# Patient Record
Sex: Female | Born: 1960 | Race: Black or African American | Hispanic: No | Smoking: Never smoker
Health system: Southern US, Community
[De-identification: ages and names within clinical notes are randomized; demographics above are authoritative.]

## PROBLEM LIST (undated history)

## (undated) DIAGNOSIS — E079 Disorder of thyroid, unspecified: Secondary | ICD-10-CM

## (undated) DIAGNOSIS — E0590 Thyrotoxicosis, unspecified without thyrotoxic crisis or storm: Secondary | ICD-10-CM

## (undated) DIAGNOSIS — G35 Multiple sclerosis: Secondary | ICD-10-CM

## (undated) DIAGNOSIS — D649 Anemia, unspecified: Secondary | ICD-10-CM

## (undated) DIAGNOSIS — I10 Essential (primary) hypertension: Secondary | ICD-10-CM

## (undated) HISTORY — PX: OTHER SURGICAL HISTORY: SHX169

## (undated) HISTORY — DX: Multiple sclerosis: G35

## (undated) HISTORY — PX: HERNIA REPAIR: SHX51

## (undated) HISTORY — DX: Anemia, unspecified: D64.9

## (undated) HISTORY — DX: Essential (primary) hypertension: I10

## (undated) HISTORY — DX: Thyrotoxicosis, unspecified without thyrotoxic crisis or storm: E05.90

## (undated) HISTORY — DX: Disorder of thyroid, unspecified: E07.9

## (undated) HISTORY — PX: ABDOMINAL HYSTERECTOMY: SHX81

---

## 1999-12-08 ENCOUNTER — Ambulatory Visit (HOSPITAL_COMMUNITY): Admission: RE | Admit: 1999-12-08 | Discharge: 1999-12-08 | Payer: Self-pay

## 2001-06-30 ENCOUNTER — Inpatient Hospital Stay (HOSPITAL_COMMUNITY): Admission: RE | Admit: 2001-06-30 | Discharge: 2001-07-02 | Payer: Self-pay

## 2001-06-30 ENCOUNTER — Encounter (INDEPENDENT_AMBULATORY_CARE_PROVIDER_SITE_OTHER): Payer: Self-pay

## 2003-07-27 ENCOUNTER — Ambulatory Visit (HOSPITAL_COMMUNITY): Admission: RE | Admit: 2003-07-27 | Discharge: 2003-07-27 | Payer: Self-pay | Admitting: Family Medicine

## 2003-08-13 ENCOUNTER — Ambulatory Visit (HOSPITAL_COMMUNITY): Admission: RE | Admit: 2003-08-13 | Discharge: 2003-08-13 | Payer: Self-pay | Admitting: Interventional Cardiology

## 2003-08-25 ENCOUNTER — Ambulatory Visit (HOSPITAL_COMMUNITY): Admission: RE | Admit: 2003-08-25 | Discharge: 2003-08-25 | Payer: Self-pay | Admitting: Interventional Cardiology

## 2003-09-02 ENCOUNTER — Other Ambulatory Visit: Admission: RE | Admit: 2003-09-02 | Discharge: 2003-09-02 | Payer: Self-pay | Admitting: Family Medicine

## 2004-04-30 DIAGNOSIS — G35 Multiple sclerosis: Secondary | ICD-10-CM

## 2004-04-30 HISTORY — DX: Multiple sclerosis: G35

## 2004-06-26 ENCOUNTER — Encounter: Admission: RE | Admit: 2004-06-26 | Discharge: 2004-06-26 | Payer: Self-pay | Admitting: Family Medicine

## 2004-07-19 ENCOUNTER — Encounter: Admission: RE | Admit: 2004-07-19 | Discharge: 2004-07-19 | Payer: Self-pay | Admitting: Neurology

## 2004-08-15 IMAGING — CT CT ANGIO CHEST
1 of 6 series · 10 of 31 positions shown · IV contrast ([ID] OMNI 300)
Comparison: none

CLINICAL DATA: Dyspnea, chest pain. 
CT ANGIO CHEST ? 08/25/03
Comparison to noncontrast CT chest of 08/13/03.
TECHNIQUE: Multidetector CT imaging of the chest was performed according to the protocol for detection of pulmonary embolism during IV bolus injection of 366cc Omnipaque 300.  Coronal and sagittal plane reformatted images were also generated.  
No filling defects in the pulmonary arterial system are noted to suggest pulmonary emboli.  Heart and great vessels are unremarkable. No enlarged lymph nodes in axillary mediastinal or hilar regions.  Unchanged 4 mm nodule on the right minor fissure (image 107) and a 5 mm nodule on the lateral right lower lobe (image 152) are unchanged.  The remainder of the lungs is clear.

[Series 3: pe w/ lower ext · axial · 0.70mm/px · z∈[-340,-65]mm · 10 of 276 slices shown]
[im 28/276  lung]
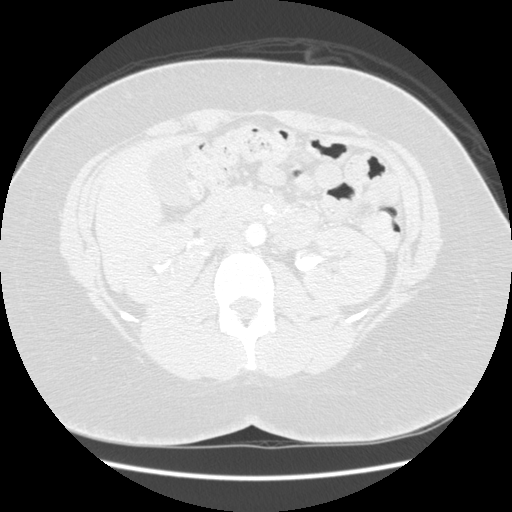
[im 56/276  mediastinal]
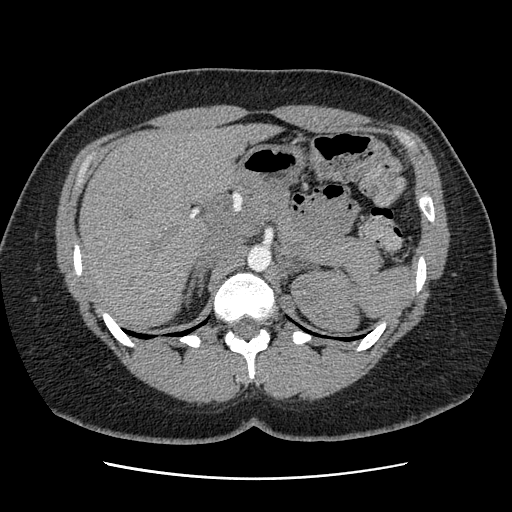
[im 83/276  lung]
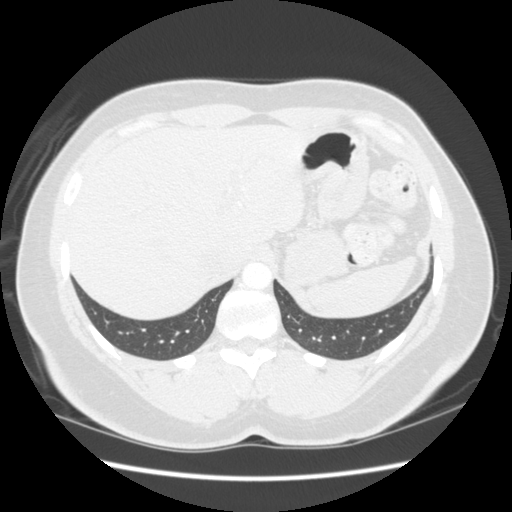
[im 111/276  mediastinal]
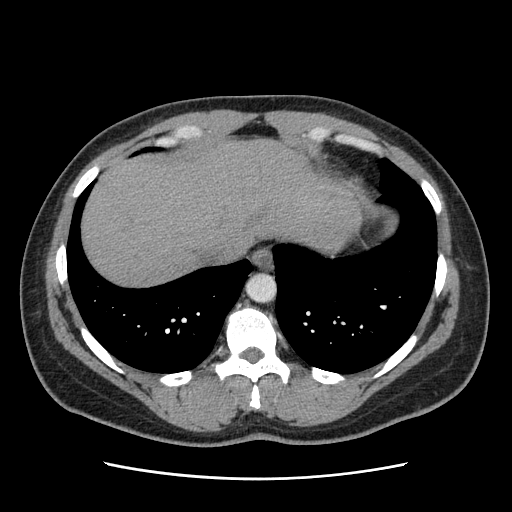
[im 136/276  lung]
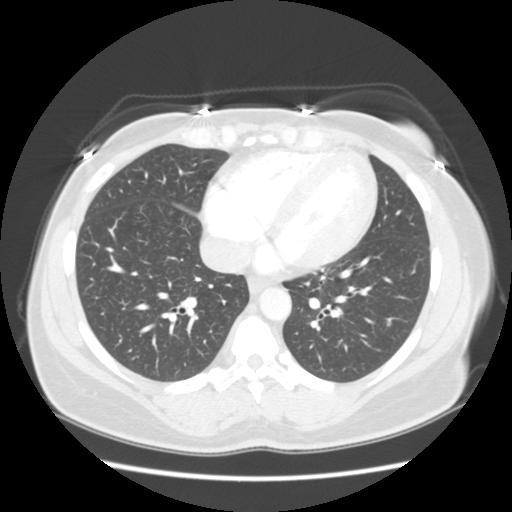
[im 138/276  mediastinal]
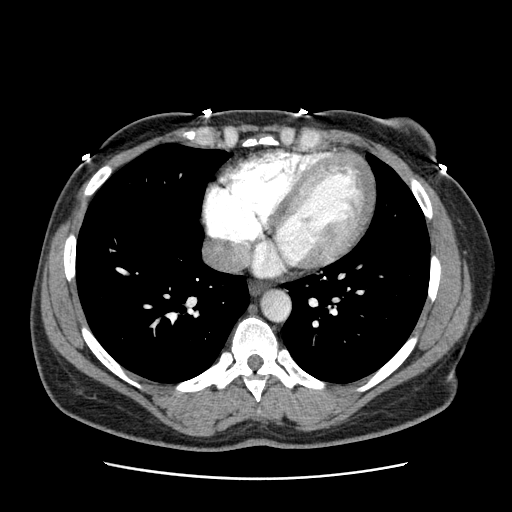
[im 166/276  lung]
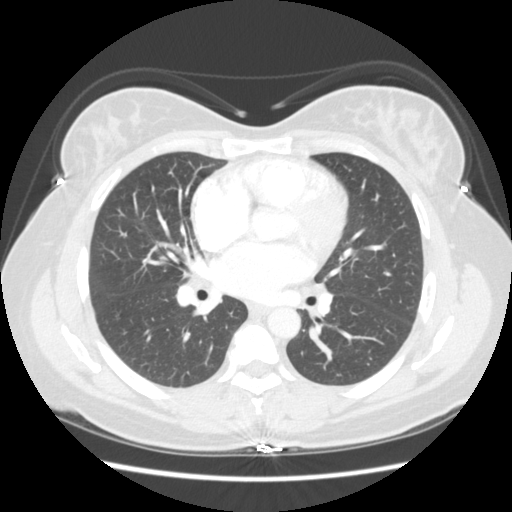
[im 193/276  mediastinal]
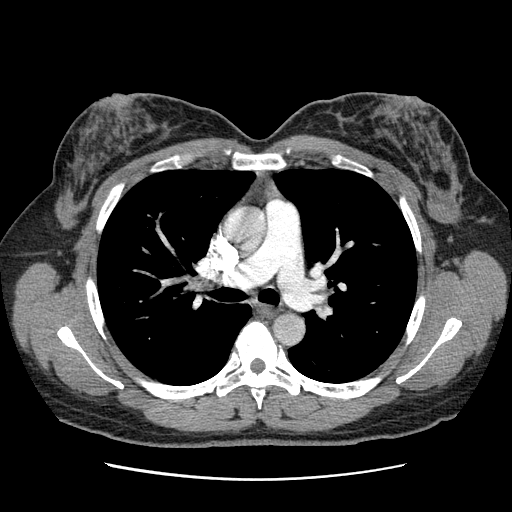
[im 221/276  lung]
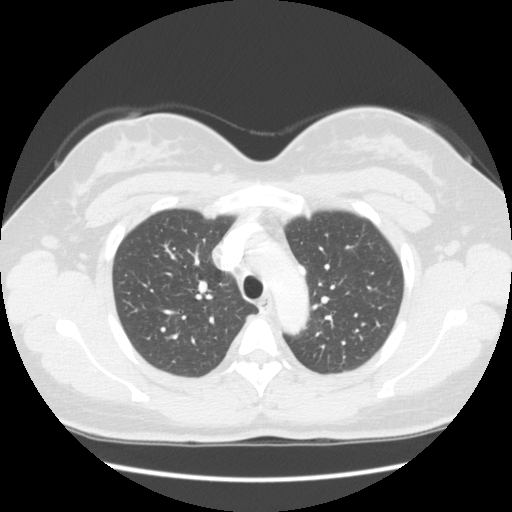
[im 248/276  mediastinal]
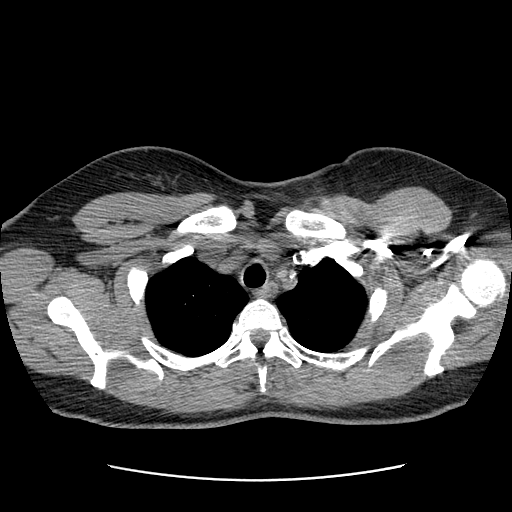

[10 of 31 positions shown; findings below may reference images not displayed]

IMPRESSION: No evidence of pulmonary embolism. 
Two small right pulmonary/pleural nodules.  These probably are post inflammatory but consider limited CT follow-up of these areas in 3-6 months as indicated.

## 2007-01-08 ENCOUNTER — Emergency Department (HOSPITAL_COMMUNITY): Admission: EM | Admit: 2007-01-08 | Discharge: 2007-01-09 | Payer: Self-pay | Admitting: Emergency Medicine

## 2007-01-17 ENCOUNTER — Ambulatory Visit (HOSPITAL_COMMUNITY): Admission: RE | Admit: 2007-01-17 | Discharge: 2007-01-17 | Payer: Self-pay | Admitting: Urology

## 2007-01-20 ENCOUNTER — Emergency Department (HOSPITAL_COMMUNITY): Admission: EM | Admit: 2007-01-20 | Discharge: 2007-01-20 | Payer: Self-pay | Admitting: Emergency Medicine

## 2007-01-20 ENCOUNTER — Encounter (INDEPENDENT_AMBULATORY_CARE_PROVIDER_SITE_OTHER): Payer: Self-pay | Admitting: Emergency Medicine

## 2010-09-15 NOTE — Discharge Summary (Signed)
[facility]  Patient:    Alexis Cervantes, Alexis Cervantes Visit Number: [id number] MRN: [id number]          Service Type: GYN Location: 9300 [id number] Attending Physician:  Barbaraann Cao Dictated by:   Ronda Fairly. Galen Daft, M.D. Admit Date:  06/30/2001 Discharge Date: 07/02/2001                             Discharge Summary  PRINCIPAL DIAGNOSIS:          Uterine fibroids, symptomatic.  SECONDARY DIAGNOSES:          1. Anemia.                               2. Pelvic pain.  COMPLICATIONS OF HOSPITALIZATION:              None.  PRINCIPAL PROCEDURE:          Total abdominal hysterectomy.  CONDITION ON DISCHARGE:       Improved.  HOSPITAL COURSE:              The patient was admitted on June 30, 2001 for total abdominal hysterectomy. This was carried out without difficulty and the ovaries were left in the patient as planned. The patient was doing well on the first postoperative day. She had started to tolerate clear liquids without difficulty during that day and was advanced to solids by the evening. Catheter had been removed and she was voiding spontaneously.  On the second postoperative day, she demonstrated no evidence of abdominal distention. She had good bowel sounds and the abdomen was soft and benign. The incision was clean, dry, and intact. The Steri-Strips were left in place. These were to be removed on postoperative day #7. The hemoglobin postoperatively was 8 grams and she was given discharge instructions of taking one iron tablet per day, Chromagen Forte and Percocet for pain. She had no fever throughout the hospital course. She was doing well on the ambulation standpoint and self sufficiency.  The wound care instructions, diet, restrictions for activity, and followup for the office were all reviewed with the patient prior to discharge. Dictated by:   Ronda Fairly. Galen Daft, M.D. Attending Physician:  Barbaraann Cao DD:  07/02/01 TD:  07/04/01 Job:  [id number]

## 2010-09-15 NOTE — Op Note (Signed)
[facility]  Patient:    Alexis Cervantes, Alexis Cervantes                     MRN: [id number] Proc. Date: 12/08/99 Adm. Date:  [id number] Disc. Date: [id number] Attending:  Meredith Leeds CC:         Stacie Acres. Cliffton Asters, M.D.   Operative Report  PREOPERATIVE DIAGNOSIS:  Epigastric hernia.  POSTOPERATIVE DIAGNOSIS:  Epigastric hernia.  PROCEDURE:  Repair of epigastric hernia.  SURGEON:  Zigmund Daniel, M.D.  ANESTHESIA:  General.  DESCRIPTION OF PROCEDURE:  After adequate general anesthesia and monitoring and routine preparation and draping of the abdomen, I made a midline incision just above the umbilicus approximately 6 cm long.  I dissected down until I encountered the incarcerated fat of the epigastric hernia, and I separated the hernia and its sac from the surrounding normal subcutaneous tissue.  I incised all the way down to the fascia and freed up the fascia from the hernia at that level. The hernia was so large that it obviously could not be reduced.  I could not tell whether it was omentum or epigastric fat.  I segmentally clamped across it with Kelly clamps, after making sure there was no bowel in the hernia.  I then suture ligated the fat in three segments with 0 Vicryl suture.  Hemostasis was not a problem.  I primarily repaired the hernia defect with a figure-of-eight suture of 0 Prolene.  It was about 1.5 cm in size.  I freed up the fascia all around for about 2 cm in each direction with an onlay of atrium polypropylene mesh sutured down with five stitches of 0 Prolene.  I felt this provided a secure repair.  I closed the subcutaneous tissues with a couple of stitches of 4-0 Vicryl and closed the skin with intracuticular 4-0 Vicryl and Steri-Strips.  The sponge, needle and instrument counts were correct.  The patient was stable through the operation. DD:  12/08/99 TD:  12/10/99 Job: [id number]

## 2010-09-15 NOTE — Op Note (Signed)
[facility]  Patient:    Alexis Cervantes Visit Number: [id number] MRN: [id number]          Service Type: GYN Location: 9300 [id number] Attending Physician:  Barbaraann Cao Dictated by:   Ronda Fairly. Galen Daft, M.D. Proc. Date: 06/30/01 Admit Date:  06/30/2001   CC:         Stacie Acres. Cliffton Asters, M.D.   Operative Report  PREOPERATIVE DIAGNOSES:       1. Menorrhagia.                               2. Uterine fibroids.  POSTOPERATIVE DIAGNOSES:      1. Menorrhagia.                               2. Uterine fibroids.  PROCEDURE:                    Total abdominal hysterectomy.  COMPLICATIONS:                None.  SURGEON:                      Ronda Fairly. Galen Daft, M.D.  ASSISTANT:                    Rudy Jew. Ashley Royalty, M.D.  ANESTHESIA:                   General.  ESTIMATED BLOOD LOSS:         800 cc.  SPECIMENS:                    Uterus and cervix, two pieces.  DESCRIPTION OF PROCEDURE:     The patient was identified as Ms. Sullivan Lone via an obtained informed consent.  Prior to bringing her into the operating room she received prophylactic antibiotics 2 g of Ancef also.  In the operating room Betadine prep, sterile technique, general anesthesia.  The bladder was emptied with a Foley catheter throughout the procedure.  A Pfannenstiel incision was utilized for abdominal access.  The abdomen was entered and cleared of containing structures without difficulty.  The incision was adequate, and the self-retaining Balfour retractor was utilized.  The bowel was packed out of the way with two packs.  These were later removed. The ovaries were normal, and they were left in place as per the patients request.  The risks of this had been explained previously, including the risk of future ovarian problems in this scenario, and that was weighed against the risk of hormone therapy at this stage of 50 years of age.  The patient had normal-appearing tubes as well.   The uterus was removed in the following fashion:  The round ligaments were clamped, divided, and ligated with #0 Vicryl suture, and #0 Vicryl suture was used throughout the case, with the exception of the fascia which was #1 Vicryl, and the skin which was #3-0 Monocryl.  After ligating the round ligaments, the bladder flap, anterior peritoneum, and posterior peritoneum areas were created.  The ureters were identified on both sides, and separate and distinct from the surgical sites. They were identified throughout the case.  The utero-ovarian ligaments were clamped, divided, and ligated with #0 Vicryl tie, followed by #0 Vicryl suture.  The peritoneum was skeletonized around the  uterine vessels.  These were clamped on both sides and ligated with #0 Vicryl.  The successive clamps down the Cardinal ligaments were carried out, and the uterosacral ligaments were clamped and ligated and divided.  The Cardinal substance as well as the uterosacral ligaments were held at the lowermost portion, and after obtaining the blood supply from the uterines, the uterus was removed first as the supracervical specimen, to get good visualization for the lower part of the cervix.  Next, the cervix was removed intact, and there were no bleeding areas after closing the cuff with #0 Vicryl in a running fashion.  The cuff was also supported to the uterosacral Cardinal complex on either side.  Irrigation was carried out, and it was found to be complete.  There was no bleeding present. The total estimated blood loss was 800 cc.  The retractor and sponges were removed.  All instrument, sponge, and needle counts were correct as the muscles were reapproximated in the midline with two interrupted sutures of #0 Vicryl, followed by closure of the fascia with #1 Vicryl running from either angle.  All subcutaneous tissues were hemostatic, as the skin was closed with #3-0 Monocryl.  All instrument, sponge, and needle counts were  correct at the end of the case, as they were throughout the case.  There were no complications to the procedure.  The patient left the operating room in stable condition.Dictated by:   Ronda Fairly. Galen Daft, M.D. Attending Physician:  Barbaraann Cao DD:  06/30/01 TD:  06/30/01 Job: [id number]

## 2011-02-09 LAB — URINE MICROSCOPIC-ADD ON

## 2011-02-09 LAB — URINALYSIS, ROUTINE W REFLEX MICROSCOPIC
Bilirubin Urine: NEGATIVE
Glucose, UA: NEGATIVE
Protein, ur: NEGATIVE

## 2012-02-05 ENCOUNTER — Ambulatory Visit: Payer: BC Managed Care – PPO

## 2012-02-05 ENCOUNTER — Ambulatory Visit (INDEPENDENT_AMBULATORY_CARE_PROVIDER_SITE_OTHER): Payer: BC Managed Care – PPO | Admitting: Family Medicine

## 2012-02-05 VITALS — BP 143/93 | HR 70 | Temp 97.7°F | Resp 18 | Ht <= 58 in | Wt 220.0 lb

## 2012-02-05 DIAGNOSIS — R05 Cough: Secondary | ICD-10-CM

## 2012-02-05 DIAGNOSIS — J189 Pneumonia, unspecified organism: Secondary | ICD-10-CM

## 2012-02-05 DIAGNOSIS — I10 Essential (primary) hypertension: Secondary | ICD-10-CM

## 2012-02-05 DIAGNOSIS — G35 Multiple sclerosis: Secondary | ICD-10-CM

## 2012-02-05 LAB — POCT CBC
Hemoglobin: 12.3 g/dL (ref 12.2–16.2)
Lymph, poc: 2.2 (ref 0.6–3.4)
MCH, POC: 28 pg (ref 27–31.2)
MCV: 91.2 fL (ref 80–97)
MID (cbc): 0.3 (ref 0–0.9)
RBC: 4.4 M/uL (ref 4.04–5.48)
WBC: 5.1 10*3/uL (ref 4.6–10.2)

## 2012-02-05 MED ORDER — HYDROCODONE-HOMATROPINE 5-1.5 MG/5ML PO SYRP: 5.0000 mL | ORAL_SOLUTION | Freq: Three times a day (TID) | ORAL | Status: DC | PRN

## 2012-02-05 MED ORDER — CEFTRIAXONE SODIUM 1 G IJ SOLR
1.0000 g | Freq: Once | INTRAMUSCULAR | Status: AC
  Administered 2012-02-05: 1 g via INTRAMUSCULAR

## 2012-02-05 MED ORDER — LEVOFLOXACIN 750 MG PO TABS: 750.0000 mg | ORAL_TABLET | Freq: Every day | ORAL | Status: DC

## 2012-02-05 NOTE — Progress Notes (Signed)
Subjective:    Patient ID: Alexis Cervantes, female    DOB: 08-04-1960, 51 y.o.   MRN: [id number]  HPI  5d of terrible cold.  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  Pt w/ MS so immune system suppressed.  Had fever up to 101 which has resolved but with persistent dry cough and wheezing, nasal congestion, no pharyngitis or sinus pressure. Not sleeping due to ocugh. Tried robitussin and nyquil w/o relief.  Dec appetite, no n/v, slight diarrhea.  Difficult to tell about myalgias/arthralgias due to MS.  When she was first ill she had numness in her legs and ill but no pain.  Sees Dr. Lesia Sago with [facility].  Review of Systems  Constitutional: Positive for fever, chills, activity change, appetite change and fatigue. Negative for diaphoresis.  HENT: Positive for congestion, rhinorrhea and postnasal drip. Negative for ear pain, sore throat, sneezing and sinus pressure.   Eyes: Negative for visual disturbance.  Respiratory: Positive for cough and wheezing. Negative for shortness of breath.   Gastrointestinal: Positive for diarrhea. Negative for nausea, vomiting and constipation.  Musculoskeletal: Positive for myalgias and arthralgias. Negative for back pain and joint swelling.  Neurological: Positive for weakness and numbness.  Psychiatric/Behavioral: Positive for disturbed wake/sleep cycle.       Past Medical History  Diagnosis Date  . MS (multiple sclerosis) 2006  . Anemia   . Hypertension    BP 143/93  Pulse 70  Temp 97.7 F (36.5 C) (Oral)  Resp 18  Ht 4\' 8"  (1.422 m)  Wt 220 lb (99.791 kg)  BMI 49.32 kg/m2  SpO2 96%  Objective:   Physical Exam  Constitutional: She is oriented to person, place, and time. She appears well-developed and well-nourished. No distress.  HENT:  Head: Normocephalic and atraumatic.  Right Ear: Tympanic membrane, external ear and ear canal normal.  Left Ear: Tympanic membrane, external ear and ear canal normal.  Nose:  Mucosal edema present. No rhinorrhea.  Mouth/Throat: Uvula is midline and mucous membranes are normal. Posterior oropharyngeal erythema present. No oropharyngeal exudate.  Eyes: Conjunctivae normal are normal. No scleral icterus.  Neck: Normal range of motion. Neck supple. No thyromegaly present.  Cardiovascular: Normal rate and normal heart sounds.   Pulmonary/Chest: Effort normal. She has wheezes in the left lower field. She has rales in the left lower field.  Musculoskeletal: She exhibits no edema.  Lymphadenopathy:    She has cervical adenopathy.  Neurological: She is alert and oriented to person, place, and time.  Skin: Skin is warm and dry. She is not diaphoretic. No erythema.  Psychiatric: She has a normal mood and affect. Her behavior is normal.          Results for orders placed in visit on 02/05/12  POCT CBC      Component Value Range   WBC 5.1  4.6 - 10.2 K/uL   Lymph, poc 2.2  0.6 - 3.4   POC LYMPH PERCENT 43.1  10 - 50 %L   MID (cbc) 0.3  0 - 0.9   POC MID % 6.2  0 - 12 %M   POC Granulocyte 2.6  2 - 6.9   Granulocyte percent 50.7  37 - 80 %G   RBC 4.40  4.04 - 5.48 M/uL   Hemoglobin 12.3  12.2 - 16.2 g/dL   HCT, POC 78.2  95.6 - 47.9 %   MCV 91.2  80 - 97 fL   MCH, POC 28.0  27 - 31.2 pg   MCHC 30.7 (*) 31.8 - 35.4 g/dL   RDW, POC 08.6     Platelet Count, POC 346  142 - 424 K/uL   MPV 9.2  0 - 99.8 fL   [facility] reading (PRIMARY) by  Dr. Clelia Croft.  CXR left lower lobe with medistinal adenopathy and vasculature.   Assessment & Plan:  1. Pneumonia - Rocephin 1 gm x 1 now.  Levaquin x7d sent to pharmacy. Prn hycodan for cough 2. HTN - Elev today but acutely ill. Not on meds, rechecked at f/u

## 2012-02-10 ENCOUNTER — Telehealth: Payer: Self-pay

## 2012-02-10 NOTE — Telephone Encounter (Signed)
Work note provided, patient called and advised it is at front desk.

## 2012-02-10 NOTE — Telephone Encounter (Signed)
Patient states she was given [facility] note to return tomorrow 10/14. She is better, but not 100%. She works with clients and in front of people and does not want to pass the illness along, so she is requesting a note until 10/16. Please call at [phone].

## 2012-03-18 ENCOUNTER — Telehealth: Payer: Self-pay

## 2012-03-18 NOTE — Telephone Encounter (Signed)
Pt needs to get a copy of her last medical record where she was diagnosed with pneumonia Please call pt when records are ready

## 2012-03-19 NOTE — Telephone Encounter (Signed)
Last office notes related to pneumonia diagnosis printed and ready for pickup. Patient notified and states her son will pick up at noon.

## 2012-06-17 ENCOUNTER — Other Ambulatory Visit: Payer: Self-pay | Admitting: Family Medicine

## 2012-06-18 NOTE — Telephone Encounter (Signed)
Please pull chart. Not rx'ed in [facility] 

## 2012-06-19 NOTE — Telephone Encounter (Signed)
Chart pulled to PA pool DOS 02/13/10

## 2012-07-14 ENCOUNTER — Other Ambulatory Visit: Payer: Self-pay | Admitting: Neurology

## 2012-07-14 DIAGNOSIS — G35 Multiple sclerosis: Secondary | ICD-10-CM

## 2012-07-21 ENCOUNTER — Telehealth: Payer: Self-pay | Admitting: Neurology

## 2012-07-21 NOTE — Telephone Encounter (Signed)
Patient call wanting to see if she can get an x-ray to see if she can find out why the pain in her hip and legs is still sever. She stated that she has use the pain medications that you had given her and try some over the counter pain medication and nothing has been able to help. She want you to give her a call to see what the next step will be.

## 2012-07-21 NOTE — Telephone Encounter (Signed)
Give her an appt with me soon.

## 2012-07-22 ENCOUNTER — Ambulatory Visit (INDEPENDENT_AMBULATORY_CARE_PROVIDER_SITE_OTHER): Payer: Self-pay | Admitting: Neurology

## 2012-07-22 ENCOUNTER — Other Ambulatory Visit: Payer: Self-pay | Admitting: Neurology

## 2012-07-22 ENCOUNTER — Telehealth: Payer: Self-pay | Admitting: *Deleted

## 2012-07-22 VITALS — BP 153/87 | HR 70 | Temp 97.4°F | Wt 220.0 lb

## 2012-07-22 DIAGNOSIS — I10 Essential (primary) hypertension: Secondary | ICD-10-CM

## 2012-07-22 DIAGNOSIS — G35 Multiple sclerosis: Secondary | ICD-10-CM

## 2012-07-22 MED ORDER — HYDROCODONE-HOMATROPINE 5-1.5 MG/5ML PO SYRP: 5.0000 mL | ORAL_SOLUTION | Freq: Three times a day (TID) | ORAL | Status: DC | PRN

## 2012-07-22 MED ORDER — HYDROCODONE-ACETAMINOPHEN 7.5-500 MG PO TABS: 1.0000 | ORAL_TABLET | Freq: Four times a day (QID) | ORAL | Status: DC | PRN

## 2012-07-22 MED ORDER — HYDROCODONE-ACETAMINOPHEN 5-325 MG PO TABS: 1.0000 | ORAL_TABLET | Freq: Four times a day (QID) | ORAL | Status: DC | PRN

## 2012-07-22 MED ORDER — GABAPENTIN 300 MG PO CAPS: 300.0000 mg | ORAL_CAPSULE | Freq: Three times a day (TID) | ORAL | Status: DC

## 2012-07-22 NOTE — Progress Notes (Signed)
Alexis Cervantes is a 52 year old right-handed black female with a history of relapsing remitting multiple sclerosis, was randomized to Gilenya in 03/20/2012.   She was diagnosed with multiple sclerosis since Feb 2006.  She has been on Betaseron. She ran out of her Betaseron in October of 2012. The patient indicates a one year history of gradually worsening gait problems and difficulty with balance. The patient reports numbness of the hands and feet. The patient does have some blurred vision, and this improves when she closes her right eye.  She denies problems controlling the bowels or the bladder. She denies any significant memory issues.   Abnormal MRI brain: multiple supratentorial (periventricular and subcortical) and infratentorial (cerebellar and pontine) chronic demyelinating plaques. Some of these are hypointense on T1 views. No abnormal lesions are seen on post contrast views.    MRI cervical  Multiple chronic demyelinating plaques at C2, C2-3, C3, C5, C7.  No acute plaques are seen. At C4-5, C5-6, C6-7: Spondylosis and disc bulging with severe biforaminal stenosis.  At C3-4: Disc bulging there is moderate biforaminal stenosis.  She tolerated the Gilenya well, now on short term disability,gait difficulty  She complains of left hip, leg pain, worsening gait difficulty due to pain, she denies incontinence  UPDATE March 25th, 2014, She noticed left groin pain since Feb 2014, improved after standing up, difficulty sleep, move around make it better, spastict, rubber band pulling, stretcing sensation, 10/10, her pain was under control with gabapentin 300mg  tid, and oxycodone in combination,  But she complains of light headed, confusion, she is on disability, pay out-of-pocket for her medications, and evaluation, wants to hold off x-ray of her left hip, or  MRI of her left hip right now  Physical Exam  General:  The patient is moderately obese. Head: normal cephalic Ears, Nose and Throat:  normal Neck: Neck is supple, no carotid bruits noted. Respiratory: Respiratory examination is clear. Cardiovascular: Cardiovascular examination reveals a regular rate and rhythm Musculoskeletal: no deformity Skin: Extremities are without significant edema. Trunk: normal  Neurologic Exam  Mental Status: awake, alert, cooperative on examination Cranial Nerves: Facial symmetry is present. fundi sharp bilaterally. extrocular movement was full, visual field is full on confrontational test, facial sensation and strength were normal. Hearing intact bilaterally, head turning and shoulder shrug are normal bilaterally.  Speech is well enunciated, no aphasia or dysarthria is noted.  Tongue protrusion into cheek strength were normal. Motor: mild to moderate bilateral lower extremity spasticity, mild bilateral hip flexion weakness, left worse than right. Left anterior thigh pain with cross leg exam Sensory: decreased pinprick, soft touch, vibratory sensation at right hand,   lower extremities, there is a stocking pattern pinprick sensory deficit up to the knees bilaterally, decreased vibratory sensation to midshin, preserved toe proprioception. Coordination:  finger-to-nose and heel-to-shin were normal bilaterally. Gait and Station: wide based, stiff, unsteady gait, dragging left leg.  Reflexes: hyperreflexia of bilateral upper and lower extremity muscles, nonsustained left ankle clonus, plantar responses were flexor at right, extensor on left side.  Assessment and plan: 52 years old [place] American female, with history of multiple sclerosis, now presenting with left groin area pain, most suggestive of left hip pathology,  1, will hold off evaluation because of financial reasons at this point 2. Oxycodone, gabapentin 300mg  tid  for pain

## 2012-07-22 NOTE — Telephone Encounter (Signed)
This patient is followed by Dr. Terrace Arabia, but I will change the prescription to the 5/325 mg tablets of hydrocodone.

## 2012-07-22 NOTE — Telephone Encounter (Signed)
I will change the hydrocodone prescription to a 5/325 mg tablet.

## 2012-07-22 NOTE — Telephone Encounter (Signed)
[facility] calling , pt requesting refill on lortab 7.5-500mg , not longer offered in this dosage, please advise.

## 2012-09-03 ENCOUNTER — Ambulatory Visit (INDEPENDENT_AMBULATORY_CARE_PROVIDER_SITE_OTHER): Payer: Self-pay

## 2012-09-03 ENCOUNTER — Ambulatory Visit (INDEPENDENT_AMBULATORY_CARE_PROVIDER_SITE_OTHER): Payer: Self-pay | Admitting: Neurology

## 2012-09-03 VITALS — BP 138/89 | HR 69 | Temp 98.5°F | Resp 17 | Wt 244.8 lb

## 2012-09-03 DIAGNOSIS — G35D Multiple sclerosis, unspecified: Secondary | ICD-10-CM

## 2012-09-03 DIAGNOSIS — Z0289 Encounter for other administrative examinations: Secondary | ICD-10-CM

## 2012-09-03 DIAGNOSIS — R1032 Left lower quadrant pain: Secondary | ICD-10-CM

## 2012-09-03 DIAGNOSIS — G35 Multiple sclerosis: Secondary | ICD-10-CM

## 2012-09-03 DIAGNOSIS — I10 Essential (primary) hypertension: Secondary | ICD-10-CM

## 2012-09-03 MED ORDER — FENTANYL 50 MCG/HR TD PT72: 1.0000 | MEDICATED_PATCH | TRANSDERMAL | Status: DC

## 2012-09-03 NOTE — Progress Notes (Signed)
Alexis Cervantes is a 52 year old right-handed black female with a history of relapsing remitting multiple sclerosis, was randomized to Gilenya in 03/20/2012.   She was diagnosed with multiple sclerosis since Feb 2006.  She has been on Betaseron. She ran out of her Betaseron in October of 2012. The patient indicates a one year history of gradually worsening gait problems and difficulty with balance. The patient reports numbness of the hands and feet. The patient does have some blurred vision, and this improves when she closes her right eye.  She denies problems controlling the bowels or the bladder. She denies any significant memory issues.   Abnormal MRI brain: multiple supratentorial (periventricular and subcortical) and infratentorial (cerebellar and pontine) chronic demyelinating plaques. Some of these are hypointense on T1 views. No abnormal lesions are seen on post contrast views.    MRI cervical  Multiple chronic demyelinating plaques at C2, C2-3, C3, C5, C7.  No acute plaques are seen. At C4-5, C5-6, C6-7: Spondylosis and disc bulging with severe biforaminal stenosis.  At C3-4: Disc bulging there is moderate biforaminal stenosis.  She tolerated the Gilenya well, now on short term disability,gait difficulty  She complains of left hip, leg pain, worsening gait difficulty due to pain, she denies incontinence  UPDATE May 7th 2014: She noticed left groin pain since Feb 2014, improved after standing up, difficulty sleep, move around make it better, spastict, rubber band pulling, stretcing sensation, 10/10, her pain was under control with gabapentin 300mg  tid, and oxycodone in combination,  she continued to have left leg pain, and has moved from left groin to left anterior thigh area, constant tooth achy pain, 8 out of 10, was helped by oxycodone, she has Medicaid now, desire further evaluation, worsening gait difficulty because of left leg pain.   Physical Exam  General:  The patient is moderately  obese. Head: normal cephalic Ears, Nose and Throat: normal Neck: Neck is supple, no carotid bruits noted. Respiratory: Respiratory examination is clear. Cardiovascular: Cardiovascular examination reveals a regular rate and rhythm Musculoskeletal: no deformity Skin: Extremities are without significant edema. Trunk: normal  Neurologic Exam  Mental Status: awake, alert, cooperative on examination Cranial Nerves: Facial symmetry is present. fundi sharp bilaterally. extrocular movement was full, visual field is full on confrontational test, facial sensation and strength were normal. Hearing intact bilaterally, head turning and shoulder shrug are normal bilaterally.  Speech is well enunciated, no aphasia or dysarthria is noted.  Tongue protrusion into cheek strength were normal. Motor: mild to moderate bilateral lower extremity spasticity, mild bilateral hip flexion weakness, left worse than right, also limited by left thigh pain. Left anterior thigh pain with cross leg exam Sensory: decreased pinprick, soft touch, vibratory sensation at right hand, lower extremities, there is a stocking pattern pinprick sensory deficit up to the knees bilaterally, decreased vibratory sensation to midshin, preserved toe proprioception. Coordination:  finger-to-nose and heel-to-shin were normal bilaterally. Gait and Station: wide based, stiff, unsteady gait, dragging left leg.  Reflexes: hyperreflexia of bilateral upper and lower extremity muscles, nonsustained left ankle clonus, plantar responses were flexor at right, extensor on left side.  Assessment and plan: 52 years old [place] American female, with history of multiple sclerosis, now presenting with left groin area pain, and left anterior thigh pain, most suggestive of left hip pathology, and she denies low back pain  1, fentanyl patch 2 MRI of left hip

## 2012-09-03 NOTE — Progress Notes (Signed)
Patient here for her Visit 5 month 6 in PREFER MS study. Pt is randomized to Fingolimod and tolerating study medication. Patient had MRI done today as well. Next study visit appointments made and given to pt. Labs, Pros, MSQLI and SDMT was completed as per protocol. Dr.Yan in to complete ambulation, bradycardia assessed, neuro and physical exam. Patient was give FYI medication guide. Study drug bottles was returned with 34 pills and new study drug bottles was dispensed to patient.

## 2012-09-04 MED ORDER — GADOPENTETATE DIMEGLUMINE 469.01 MG/ML IV SOLN: 20.0000 mL | Freq: Once | INTRAVENOUS | Status: AC | PRN

## 2012-09-09 NOTE — Progress Notes (Signed)
Quick Note:  [place], please call patient MRI result ______

## 2012-09-15 ENCOUNTER — Other Ambulatory Visit: Payer: Self-pay

## 2012-09-25 DIAGNOSIS — Z0289 Encounter for other administrative examinations: Secondary | ICD-10-CM

## 2012-10-16 ENCOUNTER — Inpatient Hospital Stay: Admission: RE | Admit: 2012-10-16 | Payer: Self-pay | Source: Ambulatory Visit

## 2012-10-30 ENCOUNTER — Other Ambulatory Visit: Payer: Self-pay | Admitting: Neurology

## 2012-10-30 DIAGNOSIS — G35 Multiple sclerosis: Secondary | ICD-10-CM

## 2012-11-27 ENCOUNTER — Ambulatory Visit (INDEPENDENT_AMBULATORY_CARE_PROVIDER_SITE_OTHER): Payer: Self-pay | Admitting: Neurology

## 2012-11-27 ENCOUNTER — Encounter: Payer: Self-pay | Admitting: Neurology

## 2012-11-27 VITALS — BP 136/85 | HR 68 | Temp 97.9°F | Resp 18 | Wt 237.2 lb

## 2012-11-27 DIAGNOSIS — R1032 Left lower quadrant pain: Secondary | ICD-10-CM

## 2012-11-27 DIAGNOSIS — G35 Multiple sclerosis: Secondary | ICD-10-CM

## 2012-11-27 DIAGNOSIS — I10 Essential (primary) hypertension: Secondary | ICD-10-CM

## 2012-11-27 NOTE — Progress Notes (Signed)
Alexis Cervantes is a 52 year old right-handed black female with a history of relapsing remitting multiple sclerosis, was randomized to Gilenya in 03/20/2012.   She was diagnosed with multiple sclerosis since Feb 2006.  She has been on Betaseron. She ran out of her Betaseron in October of 2012. The patient indicates a one year history of gradually worsening gait problems and difficulty with balance. The patient reports numbness of the hands and feet. The patient does have some blurred vision, and this improves when she closes her right eye.  She denies problems controlling the bowels or the bladder. She denies any significant memory issues.   Abnormal MRI brain: multiple supratentorial (periventricular and subcortical) and infratentorial (cerebellar and pontine) chronic demyelinating plaques. Some of these are hypointense on T1 views. No abnormal lesions are seen on post contrast views.    MRI cervical  Multiple chronic demyelinating plaques at C2, C2-3, C3, C5, C7.  No acute plaques are seen. At C4-5, C5-6, C6-7: Spondylosis and disc bulging with severe biforaminal stenosis.  At C3-4: Disc bulging there is moderate biforaminal stenosis.  She tolerated the Gilenya well, now on short term disability,gait difficulty  She complains of left hip, leg pain, worsening gait difficulty due to pain, she denies incontinence  UPDATE July 31st 2014:   She no longer has left groin area pain, tolerating Gilenya well, no significant side effect, she has still wide based, unsteady gait, bilateral lower extremity paresthesia.   Physical Exam  General:  The patient is moderately obese. Head: normal cephalic Ears, Nose and Throat: normal Neck: Neck is supple, no carotid bruits noted. Respiratory: Respiratory examination is clear. Cardiovascular: Cardiovascular examination reveals a regular rate and rhythm Musculoskeletal: no deformity Skin: Extremities are without significant edema. Trunk: normal  Neurologic Exam   Mental Status: awake, alert, cooperative on examination Cranial Nerves: Facial symmetry is present. fundi sharp bilaterally. extrocular movement was full, visual field is full on confrontational test, facial sensation and strength were normal. Hearing intact bilaterally, head turning and shoulder shrug are normal bilaterally.  Speech is well enunciated, no aphasia or dysarthria is noted.  Tongue protrusion into cheek strength were normal. Motor: mild to moderate bilateral lower extremity spasticity, mild bilateral hip flexion weakness, upper extremity motor strength is normal. Sensory: decreased pinprick, soft touch, vibratory sensation at right hand, lower extremities, there is a stocking pattern pinprick sensory deficit up to the knees bilaterally, decreased vibratory sensation to midshin, preserved toe proprioception. Coordination:  finger-to-nose and heel-to-shin were normal bilaterally. Gait and Station: wide based, stiff, unsteady gait, cautious. Reflexes: hyperreflexia of bilateral upper and lower extremity muscles, nonsustained left ankle clonus, plantar responses were flexor bilaterally.  Assessment and plan: 52 years old [place] American female, with history of relapsing remitting multiple sclerosis, in PREFER trial, was randomized to Gilenya since 11.21.2013.  1. Moderate exercise. 2. RTC per protocol.

## 2012-11-27 NOTE — Progress Notes (Signed)
Patient here for her Visit 6 month 9 in PREFER MS study.  Pt is randomized to Fingolimod and tolerating study medication. Pt has had no con med or adverse events since last visit and has no new, but worsening MS symptoms today. Next study visit appointments made and given to pt. Labs and PRO was completed as per protocol. Dr.Yan in to complete bradycardia, ambulation assessment. neuro and physical exam. Patient continue to meet all inclusion and exclusion criteria to continue in study. Patient was give FYI medication guide. Study drug bottles was returned with 24 pills and new study drug bottles was dispensed to patient. MRI was also scheduled.

## 2013-01-30 ENCOUNTER — Telehealth: Payer: Self-pay

## 2013-01-30 NOTE — Telephone Encounter (Signed)
[employer] [facility] called, left message asking for a call back at [phone].  I called them back.  Spoke with Alinda Money.  They just wanted to know if this was continuation of therapy or a new start on Gilenya.  Patient was in the Study and has been taking Gilenya since 2013.  They have updated notes and will release the order.

## 2013-02-12 ENCOUNTER — Ambulatory Visit (INDEPENDENT_AMBULATORY_CARE_PROVIDER_SITE_OTHER): Payer: Self-pay

## 2013-02-12 ENCOUNTER — Ambulatory Visit (INDEPENDENT_AMBULATORY_CARE_PROVIDER_SITE_OTHER): Payer: Self-pay | Admitting: Neurology

## 2013-02-12 VITALS — BP 139/83 | HR 67 | Temp 98.2°F | Resp 19 | Wt 240.5 lb

## 2013-02-12 DIAGNOSIS — G35 Multiple sclerosis: Secondary | ICD-10-CM

## 2013-02-12 DIAGNOSIS — G35D Multiple sclerosis, unspecified: Secondary | ICD-10-CM

## 2013-02-12 MED ORDER — GADOPENTETATE DIMEGLUMINE 469.01 MG/ML IV SOLN: 20.0000 mL | Freq: Once | INTRAVENOUS | Status: AC | PRN

## 2013-02-12 NOTE — Progress Notes (Signed)
Alexis Cervantes is a 52 year old right-handed black female with a history of relapsing remitting multiple sclerosis, was randomized to Gilenya in 03/20/2012.   She was diagnosed with multiple sclerosis since Feb 2006.  She has been on Betaseron. She ran out of her Betaseron in October of 2012. The patient indicates a one year history of gradually worsening gait problems and difficulty with balance. The patient reports numbness of the hands and feet. The patient does have some blurred vision, and this improves when she closes her right eye.  She denies problems controlling the bowels or the bladder. She denies any significant memory issues.   Abnormal MRI brain: multiple supratentorial (periventricular and subcortical) and infratentorial (cerebellar and pontine) chronic demyelinating plaques. Some of these are hypointense on T1 views. No abnormal lesions are seen on post contrast views.    MRI cervical  Multiple chronic demyelinating plaques at C2, C2-3, C3, C5, C7.  No acute plaques are seen. At C4-5, C5-6, C6-7: Spondylosis and disc bulging with severe biforaminal stenosis.  At C3-4: Disc bulging there is moderate biforaminal stenosis.  She tolerated the Gilenya well, now on short term disability,gait difficulty  She complains of left hip, leg pain, worsening gait difficulty due to pain, she denies incontinence  UPDATE Oct 16th 2014  She no longer has left groin area pain, tolerating Gilenya well, no significant side effect, she has still wide based, unsteady gait, bilateral lower extremity paresthesia.  This is her last visit in PREFER study, she will continue to follow up in my clinic, she has significant gait difficulties.   Physical Exam  General:  The patient is moderately obese. Head: normal cephalic Ears, Nose and Throat: normal Neck: Neck is supple, no carotid bruits noted. Respiratory: Respiratory examination is clear. Cardiovascular: Cardiovascular examination reveals a regular rate and  rhythm Musculoskeletal: no deformity Skin: Extremities are without significant edema. Trunk: normal  Neurologic Exam  Mental Status: awake, alert, cooperative on examination Cranial Nerves: Facial symmetry is present. fundi sharp bilaterally. extrocular movement was full, visual field is full on confrontational test, facial sensation and strength were normal. Hearing intact bilaterally, head turning and shoulder shrug are normal bilaterally.  Speech is well enunciated, no aphasia or dysarthria is noted.  Tongue protrusion into cheek strength were normal. Motor:  moderate bilateral lower extremity spasticity, mild bilateral hip flexion weakness left 4-/right 4., upper extremity motor strength is normal with mild spasticity.. Sensory: decreased pinprick, soft touch, vibratory sensation at right hand, lower extremities, there is a stocking pattern pinprick sensory deficit up to the knees bilaterally, decreased vibratory sensation to midshin, impaired toe proprioception. Coordination:  finger-to-nose and heel-to-shin were normal bilaterally. Gait and Station: wide based, stiff, unsteady gait, cautious, spastice gait. Reflexes: hyperreflexia of bilateral upper and lower extremity muscles, nonsustained left ankle clonus, plantar responses were flexor at right, extensor at left side.  Assessment and plan: 52 years old [place] American female, with history of relapsing remitting multiple sclerosis, in PREFER trial, was randomized to Gilenya since 11.21.2013.  1. She will continue Gilenya. 2. Moderate exercise. 3. BOTOX for bilateral lower extremity spasticity.

## 2013-02-12 NOTE — Progress Notes (Signed)
Patient here for her Visit 7B/ End of Study in PREFER MS study. Pt is randomized to Fingolimod. Pt has had no change in Con Meds, no AE's or SAE's and has no new or worsening MS symptoms since last contact. Labs, Bio-markers, MRI, Pros, MSQLI, MSFC, 9-Hole Peg test, PASAT, Timed 25-Foot Walk and SDMT was completed as per protocol. Dr.Yan in to complete ambulation, bradycardia assessed, neuro and physical exam. EDSS score was 6.0. Study drug bottles was returned with 34 pills. Patient took last study dose on 02/12/13. Patient will continue on Gilenya. Patient was given samples until she received medication in the mail.

## 2013-02-16 NOTE — Progress Notes (Signed)
Quick Note:  Please call patient, MRI brain showed multiple discrete and confluent periventricular, subcortical, brainstem and cerebellar chronic demyelinating plaques. No enhancing lesions are noted.  ______

## 2013-03-02 ENCOUNTER — Telehealth: Payer: Self-pay | Admitting: Neurology

## 2013-03-04 NOTE — Telephone Encounter (Signed)
error 

## 2013-05-25 ENCOUNTER — Inpatient Hospital Stay (HOSPITAL_COMMUNITY)
Admission: EM | Admit: 2013-05-25 | Discharge: 2013-05-27 | DRG: 060 | Disposition: A | Discharge status: Home / Self Care | Payer: 59 | Attending: Internal Medicine | Admitting: Internal Medicine

## 2013-05-25 ENCOUNTER — Inpatient Hospital Stay (HOSPITAL_COMMUNITY): Payer: 59

## 2013-05-25 ENCOUNTER — Encounter (HOSPITAL_COMMUNITY): Payer: Self-pay | Admitting: Emergency Medicine

## 2013-05-25 DIAGNOSIS — R05 Cough: Secondary | ICD-10-CM | POA: Diagnosis present

## 2013-05-25 DIAGNOSIS — I517 Cardiomegaly: Secondary | ICD-10-CM | POA: Diagnosis present

## 2013-05-25 DIAGNOSIS — I10 Essential (primary) hypertension: Secondary | ICD-10-CM | POA: Diagnosis present

## 2013-05-25 DIAGNOSIS — D72819 Decreased white blood cell count, unspecified: Secondary | ICD-10-CM | POA: Diagnosis present

## 2013-05-25 DIAGNOSIS — D649 Anemia, unspecified: Secondary | ICD-10-CM | POA: Diagnosis present

## 2013-05-25 DIAGNOSIS — IMO0002 Reserved for concepts with insufficient information to code with codable children: Secondary | ICD-10-CM

## 2013-05-25 DIAGNOSIS — G35 Multiple sclerosis: Principal | ICD-10-CM | POA: Insufficient documentation

## 2013-05-25 DIAGNOSIS — R202 Paresthesia of skin: Secondary | ICD-10-CM

## 2013-05-25 DIAGNOSIS — G35D Multiple sclerosis, unspecified: Secondary | ICD-10-CM | POA: Diagnosis present

## 2013-05-25 DIAGNOSIS — R200 Anesthesia of skin: Secondary | ICD-10-CM

## 2013-05-25 DIAGNOSIS — E876 Hypokalemia: Secondary | ICD-10-CM | POA: Diagnosis present

## 2013-05-25 DIAGNOSIS — J069 Acute upper respiratory infection, unspecified: Secondary | ICD-10-CM | POA: Diagnosis present

## 2013-05-25 DIAGNOSIS — R209 Unspecified disturbances of skin sensation: Secondary | ICD-10-CM

## 2013-05-25 DIAGNOSIS — R059 Cough, unspecified: Secondary | ICD-10-CM | POA: Diagnosis present

## 2013-05-25 LAB — CBC WITH DIFFERENTIAL/PLATELET
Basophils Absolute: 0 10*3/uL (ref 0.0–0.1)
Basophils Relative: 0 % (ref 0–1)
EOS PCT: 0 % (ref 0–5)
Eosinophils Absolute: 0 10*3/uL (ref 0.0–0.7)
HEMATOCRIT: 35.2 % — AB (ref 36.0–46.0)
HEMOGLOBIN: 11.7 g/dL — AB (ref 12.0–15.0)
LYMPHS ABS: 0.2 10*3/uL — AB (ref 0.7–4.0)
LYMPHS PCT: 5 % — AB (ref 12–46)
MCH: 29.4 pg (ref 26.0–34.0)
MCHC: 33.2 g/dL (ref 30.0–36.0)
MCV: 88.4 fL (ref 78.0–100.0)
MONO ABS: 0 10*3/uL — AB (ref 0.1–1.0)
MONOS PCT: 1 % — AB (ref 3–12)
Neutro Abs: 3.2 10*3/uL (ref 1.7–7.7)
Neutrophils Relative %: 94 % — ABNORMAL HIGH (ref 43–77)
Platelets: 264 10*3/uL (ref 150–400)
RBC: 3.98 MIL/uL (ref 3.87–5.11)
RDW: 14 % (ref 11.5–15.5)
WBC: 3.4 10*3/uL — AB (ref 4.0–10.5)

## 2013-05-25 LAB — URINALYSIS, ROUTINE W REFLEX MICROSCOPIC
Bilirubin Urine: NEGATIVE
Glucose, UA: NEGATIVE mg/dL
Hgb urine dipstick: NEGATIVE
Ketones, ur: NEGATIVE mg/dL
Leukocytes, UA: NEGATIVE
Nitrite: NEGATIVE
Protein, ur: NEGATIVE mg/dL
Specific Gravity, Urine: 1.016 (ref 1.005–1.030)
Urobilinogen, UA: 1 mg/dL (ref 0.0–1.0)
pH: 6.5 (ref 5.0–8.0)

## 2013-05-25 LAB — HEPATIC FUNCTION PANEL
ALBUMIN: 3.5 g/dL (ref 3.5–5.2)
ALT: 35 U/L (ref 0–35)
AST: 33 U/L (ref 0–37)
Alkaline Phosphatase: 108 U/L (ref 39–117)
Bilirubin, Direct: <0.2 mg/dL (ref 0.0–0.3)
TOTAL PROTEIN: 7.6 g/dL (ref 6.0–8.3)
Total Bilirubin: <0.2 mg/dL — ABNORMAL LOW (ref 0.3–1.2)

## 2013-05-25 LAB — POCT I-STAT, CHEM 8
BUN: 11 mg/dL (ref 6–23)
CHLORIDE: 104 meq/L (ref 96–112)
CREATININE: 0.8 mg/dL (ref 0.50–1.10)
Calcium, Ion: 1.22 mmol/L (ref 1.12–1.23)
GLUCOSE: 110 mg/dL — AB (ref 70–99)
HCT: 37 % (ref 36.0–46.0)
Hemoglobin: 12.6 g/dL (ref 12.0–15.0)
Potassium: 3.5 mEq/L — ABNORMAL LOW (ref 3.7–5.3)
Sodium: 143 mEq/L (ref 137–147)
TCO2: 25 mmol/L (ref 0–100)

## 2013-05-25 MED ORDER — OXYCODONE-ACETAMINOPHEN 5-325 MG PO TABS: 1.0000 | ORAL_TABLET | Freq: Three times a day (TID) | ORAL | Status: DC | PRN

## 2013-05-25 MED ORDER — HEPARIN SODIUM (PORCINE) 5000 UNIT/ML IJ SOLN
5000.0000 [IU] | Freq: Three times a day (TID) | INTRAMUSCULAR | Status: DC
  Administered 2013-05-25 – 2013-05-27 (×6): 5000 [IU] via SUBCUTANEOUS
  Filled 2013-05-25 (×8): qty 1

## 2013-05-25 MED ORDER — POTASSIUM CHLORIDE CRYS ER 20 MEQ PO TBCR
20.0000 meq | EXTENDED_RELEASE_TABLET | Freq: Once | ORAL | Status: AC
  Administered 2013-05-25: 20 meq via ORAL
  Filled 2013-05-25: qty 1

## 2013-05-25 MED ORDER — OXYCODONE-ACETAMINOPHEN 5-325 MG PO TABS: 1.0000 | ORAL_TABLET | Freq: Four times a day (QID) | ORAL | Status: DC | PRN

## 2013-05-25 MED ORDER — METHYLPREDNISOLONE 16 MG PO TABS
ORAL_TABLET | ORAL | Status: DC
Start: 2013-05-25 — End: 2013-05-27

## 2013-05-25 MED ORDER — MORPHINE SULFATE 4 MG/ML IJ SOLN
6.0000 mg | Freq: Once | INTRAMUSCULAR | Status: AC
Start: 2013-05-25 — End: 2013-05-25
  Administered 2013-05-25: 6 mg via INTRAMUSCULAR
  Filled 2013-05-25: qty 2

## 2013-05-25 MED ORDER — FINGOLIMOD HCL 0.5 MG PO CAPS
1.0000 | ORAL_CAPSULE | Freq: Every day | ORAL | Status: DC
  Administered 2013-05-25 – 2013-05-27 (×3): 0.5 mg via ORAL
  Filled 2013-05-25: qty 1

## 2013-05-25 MED ORDER — VITAMIN C 500 MG PO TABS
500.0000 mg | ORAL_TABLET | Freq: Every day | ORAL | Status: DC
  Administered 2013-05-26 – 2013-05-27 (×2): 500 mg via ORAL
  Filled 2013-05-25 (×2): qty 1

## 2013-05-25 MED ORDER — METHYLPREDNISOLONE SODIUM SUCC 1000 MG IJ SOLR
1000.0000 mg | Freq: Once | INTRAMUSCULAR | Status: DC
  Administered 2013-05-25: 1000 mg via INTRAVENOUS
  Filled 2013-05-25: qty 8

## 2013-05-25 MED ORDER — IBUPROFEN 600 MG PO TABS
600.0000 mg | ORAL_TABLET | Freq: Four times a day (QID) | ORAL | Status: DC | PRN
  Administered 2013-05-25: 600 mg via ORAL
  Filled 2013-05-25: qty 1

## 2013-05-25 MED ORDER — VITAMIN D3 25 MCG (1000 UNIT) PO TABS
1000.0000 [IU] | ORAL_TABLET | Freq: Every day | ORAL | Status: DC
  Administered 2013-05-25 – 2013-05-27 (×3): 1000 [IU] via ORAL
  Filled 2013-05-25 (×3): qty 1

## 2013-05-25 MED ORDER — OXYCODONE-ACETAMINOPHEN 5-325 MG PO TABS
1.0000 | ORAL_TABLET | Freq: Once | ORAL | Status: AC
  Administered 2013-05-25: 1 via ORAL
  Filled 2013-05-25: qty 1

## 2013-05-25 MED ORDER — METHYLPREDNISOLONE ACETATE 40 MG/ML IJ SUSP
40.0000 mg | Freq: Once | INTRAMUSCULAR | Status: AC
  Administered 2013-05-25: 40 mg via INTRAMUSCULAR
  Filled 2013-05-25: qty 1

## 2013-05-25 MED ORDER — PANTOPRAZOLE SODIUM 40 MG IV SOLR
40.0000 mg | INTRAVENOUS | Status: DC
  Administered 2013-05-25: 40 mg via INTRAVENOUS
  Filled 2013-05-25 (×2): qty 40

## 2013-05-25 MED ORDER — METHYLPREDNISOLONE SODIUM SUCC 1000 MG IJ SOLR
1000.0000 mg | Freq: Every day | INTRAMUSCULAR | Status: DC
  Administered 2013-05-26 – 2013-05-27 (×2): 1000 mg via INTRAVENOUS
  Filled 2013-05-25 (×2): qty 8

## 2013-05-25 NOTE — Consult Note (Signed)
NEURO HOSPITALIST CONSULT NOTE    Reason for Consult: MS flair  HPI:                                                                                                                                          Alexis Cervantes is an 53 y.o. female with known relapsing remitting multiple sclerosis who originally was followed by Dr. Anne Hahn but recently followed by Dr. Terrace Arabia. She was diagnosed with multiple sclerosis since Feb 2006. He initial symptoms of MS were bilateral LE weakness and perioral tingling. She has been on Betaseron. She ran out of her Betaseron in October of 2012.  she was later randomized to Gilenya in 03/20/2012 and has remained on Gilenya. Previous MRI C-spine shows MRI cervical Multiple chronic demyelinating plaques at C2, C2-3, C3, C5, C7 and MRI brain showed multiple discrete and confluent periventricular, subcortical, brainstem and cerebellar chronic demyelinating plaques. Patient was doing well until this AM when she woke up and noted bilateral left>right weakness in LE.  She actually fell at one point but was able to catch herself.  She did not lose consciousness. Currently she has lower extremity weakness and decreased sensation on the left lower face.     Past Medical History  Diagnosis Date  . MS (multiple sclerosis) 2006  . Anemia   . Hypertension     Past Surgical History  Procedure Laterality Date  . Abdominal hysterectomy    . Hernia repair    . Wisedom teeth removed      Family History  Problem Relation Age of Onset  . Vision loss Mother     due to glucoma in one eye  . Diverticulitis Mother   . Glaucoma Mother   . Alcohol abuse Father   . Hypertension Sister   . Hypertension Brother   . Diverticulosis Brother   . Hypertension Brother   . Vision loss Brother     due to glucome in one eye  . Glaucoma Brother      Social History:  reports that she has never smoked. She does not have any smokeless tobacco history on file. She reports  that she does not drink alcohol or use illicit drugs.  No Known Allergies  MEDICATIONS:  Current Facility-Administered Medications  Medication Dose Route Frequency Provider Last Rate Last Dose  . methylPREDNISolone sodium succinate (SOLU-MEDROL) 1,000 mg in sodium chloride 0.9 % 50 mL IVPB  1,000 mg Intravenous Once Carlyle Dollyhristopher W Lawyer, PA-C       Current Outpatient Prescriptions  Medication Sig Dispense Refill  . cholecalciferol (VITAMIN D) 1000 UNITS tablet Take 1,000 Units by mouth daily.      . Fingolimod HCl (GILENYA) 0.5 MG CAPS Take 1 capsule by mouth daily.      Marland Kitchen. ibuprofen (ADVIL,MOTRIN) 200 MG tablet Take 600 mg by mouth every 6 (six) hours as needed for headache, mild pain or moderate pain.      . vitamin C (ASCORBIC ACID) 500 MG tablet Take 500 mg by mouth daily.      . methylPREDNISolone (MEDROL) 16 MG tablet 48mg (3 tabs) per day for 7 days, then 24mg (1 1/2 tabs) per day for 7 days, then 8mg  (1/2 tab) for 7 days  36 tablet  0  . oxyCODONE-acetaminophen (PERCOCET/ROXICET) 5-325 MG per tablet Take 1 tablet by mouth every 6 (six) hours as needed for severe pain.  15 tablet  0      ROS:                                                                                                                                       History obtained from the patient  General ROS: negative for - chills, fatigue, fever, night sweats, weight gain or weight loss Psychological ROS: negative for - behavioral disorder, hallucinations, memory difficulties, mood swings or suicidal ideation Ophthalmic ROS: negative for - blurry vision, double vision, eye pain or loss of vision ENT ROS: negative for - epistaxis, nasal discharge, oral lesions, sore throat, tinnitus or vertigo Allergy and Immunology ROS: negative for - hives or itchy/watery eyes Hematological and Lymphatic ROS: negative for -  bleeding problems, bruising or swollen lymph nodes Endocrine ROS: negative for - galactorrhea, hair pattern changes, polydipsia/polyuria or temperature intolerance Respiratory ROS: negative for - cough, hemoptysis, shortness of breath or wheezing Cardiovascular ROS: negative for - chest pain, dyspnea on exertion, edema or irregular heartbeat Gastrointestinal ROS: negative for - abdominal pain, diarrhea, hematemesis, nausea/vomiting or stool incontinence Genito-Urinary ROS: negative for - dysuria, hematuria, incontinence or urinary frequency/urgency Musculoskeletal ROS: negative for - joint swelling or muscular weakness Neurological ROS: as noted in HPI Dermatological ROS: negative for rash and skin lesion changes   Blood pressure 140/80, pulse 84, temperature 98.4 F (36.9 C), temperature source Oral, resp. rate 18, SpO2 100.00%.   Neurologic Examination:  Mental Status: Alert, oriented, thought content appropriate.  Speech fluent without evidence of aphasia.  Able to follow 3 step commands without difficulty. Cranial Nerves: II: Discs flat bilaterally; Visual fields grossly normal, pupils equal, round, reactive to light and accommodation III,IV, VI: ptosis not present, extra-ocular motions intact bilaterally V,VII: smile symmetric, facial light touch sensation decreased on the left lower face VIII: hearing normal bilaterally IX,X: gag reflex present XI: bilateral shoulder shrug XII: midline tongue extension without atrophy or fasciculations  Motor: Bilateral UE 5/5,  RIGHT hip flexion is 4/5 decreased by pain, knee extension 3/5, flexion 4-/5, DF/PF 4/5 LEFT hip flexion 4/5 decreased due to pain, knee extension 3/5, flexion 3/5, DF/PF 4-/5 Tone and bulk:normal tone throughout; no atrophy noted Sensory: decreased sensation in the left foot, pain in the right lateral thigh and left  medial thigh--no dermatomal distribution.  Deep Tendon Reflexes:  Right: Upper Extremity   Left: Upper extremity   biceps (C-5 to C-6) 2/4   biceps (C-5 to C-6) 2/4 tricep (C7) 2/4    triceps (C7) 2/4 Brachioradialis (C6) 2/4  Brachioradialis (C6) 2/4  Lower Extremity Lower Extremity  quadriceps (L-2 to L-4) 2/4   quadriceps (L-2 to L-4) 2/4 Achilles (S1) 1/4   Achilles (S1) 1/4  Plantars: Right: downgoing   Left: upgoing Cerebellar: normal finger-to-nose,  Unable to do heel-to-shin test Gait: not tested due to multiple leads CV: pulses palpable throughout    Lab Results: Basic Metabolic Panel:  Recent Labs Lab 05/25/13 0706  NA 143  K 3.5*  CL 104  GLUCOSE 110*  BUN 11  CREATININE 0.80    Liver Function Tests: No results found for this basename: AST, ALT, ALKPHOS, BILITOT, PROT, ALBUMIN,  in the last 168 hours No results found for this basename: LIPASE, AMYLASE,  in the last 168 hours No results found for this basename: AMMONIA,  in the last 168 hours  CBC:  Recent Labs Lab 05/25/13 0706  HGB 12.6  HCT 37.0    Cardiac Enzymes: No results found for this basename: CKTOTAL, CKMB, CKMBINDEX, TROPONINI,  in the last 168 hours  Lipid Panel: No results found for this basename: CHOL, TRIG, HDL, CHOLHDL, VLDL, LDLCALC,  in the last 168 hours  CBG: No results found for this basename: GLUCAP,  in the last 168 hours  Microbiology: No results found for this or any previous visit.  Coagulation Studies: No results found for this basename: LABPROT, INR,  in the last 72 hours  Imaging: MRI brain 02-16-13  FINDINGS:  The brain parenchyma shows multiple periventricular, subcortical, cerebellar and brainstem white matter hyperintensity is typical for demyelinating disease. None of the lesions show any post contrast-enhancement. The rest of the lung backwards indicates chronic disease. No other structural lesion, tumor and also noted. The subarachnoid spaces and  medical system upper normal. Orbits appear unremarkable. Calvarium shows no abnormalities. The pituitary gland appears normal. The was a large vessel for ventricles occlusion appeared patent.  IMPRESSION: Abnormal MRI scan of the brain showing multiple discrete and confluent periventricular, subcortical, brainstem and cerebellar chronic demyelinating plaques. No enhancing lesions are noted.   Assessment and plan per attending neurologist  Felicie Morn PA-C [facility] [phone]  05/25/2013, 12:16 PM   Assessment/Plan: 53 yo female with known MS on Gilenya with MS flair consisting of LE weakness left>right and left facial numbness.  I do not think that other etiologies are at all likely and would favor holding off on further workup unless patient's symptoms are  rapidly worsening.   Recommend: 1) Solumedrol 1 gram daily for 5 days 2) Protonix 40 mg IV daily for GI protection 3) PT/OT  Ritta Slot, MD [facility] [phone]  If 7pm- 7am, please page neurology on call at [phone].

## 2013-05-25 NOTE — ED Notes (Signed)
MD at bedside. (ED Physician) 

## 2013-05-25 NOTE — ED Notes (Signed)
Per EMS, the patient was attempting to void this morning, and fell.  No injury from the fall, and patient reports she did not hit anything on the way down.  EMS assisted her to a sitting position, but the numbness feeling in legs never subsided.  Last set of blood pressure reading with EMS was 148/82, pulse 82. Patient remained conscious and alert the entire time. No treatment received per EMS.

## 2013-05-25 NOTE — Discharge Planning (Signed)
[facility] Alexis Cervantes, [employer] to the patient about primary care, patient stated she has a new insurance card with her PCP's name on it. My contact information given for any questions or concerns.

## 2013-05-25 NOTE — ED Notes (Signed)
Attempted report 

## 2013-05-25 NOTE — H&P (Signed)
Hospital Admission Note Date: 05/25/2013  Patient name: Alexis Cervantes Medical record number: [id number] Date of birth: 1960/12/20 Age: 53 y.o. Gender: female PCP: Levert Feinstein, MD  Medical Service: IMTS   Attending physician: Dr. Meredith Pel  Internal Medicine Teaching Service Contact Information  Weekday Hours (7AM-5PM):   1st contact:  Dr. Otis Brace       pgr:  [phone]  2nd contact:  Dr. Darden Palmer    pgr:  [phone]   ** If no return call within 15 minutes (after trying both pagers listed above), please call after hours pagers.   After 5 pm or weekends: 1st Contact: Pager: [phone] 2nd Contact: Pager: [phone]  Chief Complaint: Weakness and decreased sensation in legs  History of Present Illness: Patient is a 53 year old lady with a past medical history of multiple sclerosis (diagnosed on 2006), , who presents with weakness and decreased sensation in legs bilaterally.  The patient reports that she is currently taking Gilenya for her multiple sclerosis at home . She did not miss any doses. She has been in her baseline health condition until this morning. Patient reports that she woke up at about 3:00 AM and was about to to the bathroom, but fell on the ground without any injury, then she realized that she could not feel her legs. Her legs were very weak bilaterally. Patient did not lose consciousness, no urinary incontinence, did not lose control of bladder. The patient states she normally has numbness and weakness in her feet, but not as severe as today. She has intermittent blurry vision, which has not changed significantly.. When I evaluated the patient, patient feels fine, but could not stand up to have a walk in ED.   The patient reports having mild dry cough in the past 3 days, but no fever, chills, shortness of breath or chest pain.  ROS:  Denies nausea, vomiting, fever, chills, headaches, cough, chest pain, SOB, abdominal pain, diarrhea, constipation, dysuria, urgency,  frequency, hematuria or leg swelling. She has pain over upper thigh laterally.   Meds: Current Outpatient Rx  Name  Route  Sig  Dispense  Refill  . cholecalciferol (VITAMIN D) 1000 UNITS tablet   Oral   Take 1,000 Units by mouth daily.         . Fingolimod HCl (GILENYA) 0.5 MG CAPS   Oral   Take 1 capsule by mouth daily.         Marland Kitchen ibuprofen (ADVIL,MOTRIN) 200 MG tablet   Oral   Take 600 mg by mouth every 6 (six) hours as needed for headache, mild pain or moderate pain.         . vitamin C (ASCORBIC ACID) 500 MG tablet   Oral   Take 500 mg by mouth daily.         . methylPREDNISolone (MEDROL) 16 MG tablet      48mg (3 tabs) per day for 7 days, then 24mg (1 1/2 tabs) per day for 7 days, then 8mg  (1/2 tab) for 7 days   36 tablet   0   . oxyCODONE-acetaminophen (PERCOCET/ROXICET) 5-325 MG per tablet   Oral   Take 1 tablet by mouth every 6 (six) hours as needed for severe pain.   15 tablet   0    Allergies: Allergies as of 05/25/2013  . (No Known Allergies)   Past Medical History  Diagnosis Date  . MS (multiple sclerosis) 2006  . Anemia   . Hypertension    Past Surgical History  Procedure Laterality Date  .  Abdominal hysterectomy    . Hernia repair    . Wisedom teeth removed     Family History  Problem Relation Age of Onset  . Vision loss Mother     due to glucoma in one eye  . Diverticulitis Mother   . Glaucoma Mother   . Alcohol abuse Father   . Hypertension Sister   . Hypertension Brother   . Diverticulosis Brother   . Hypertension Brother   . Vision loss Brother     due to glucome in one eye  . Glaucoma Brother    History   Social History  . Marital Status: Divorsed    Spouse Name: N/A    Number of Children: 3 daughers  . Years of Education: N/A   Occupational History  . Not on file.   Social History Main Topics  . Smoking status: Never Smoker   . Smokeless tobacco: Not on file  . Alcohol Use: No  . Drug Use: No  . Sexual  Activity: Not on file   Other Topics Concern  . Not on file   Social History Narrative  .  divorced, living [place] adopted [place] in [place], has 3 daughters who are healthy, denies smoking, drinking alcohol or using drugs. No recent long distance traveling     Review of Systems: Full 14-point review of systems otherwise negative except as noted above in HPI.  Physical Exam:   Filed Vitals:   05/25/13 0630 05/25/13 0815 05/25/13 1000 05/25/13 1030  BP: 162/84 138/75 138/70 140/80  Pulse: 82 88 82 84  Temp:      TempSrc:      Resp:      SpO2: 100% 98% 96% 100%    General: Not in acute distress HEENT: PERRL, EOMI. There is horizontal nystagmus bilaterally.  no scleral icterus, No JVD or bruit Cardiac: S1/S2, RRR, No murmurs, gallops or rubs Pulm: Good air movement bilaterally. Clear to auscultation bilaterally. No rales, wheezing, rhonchi or rubs. Abd: Soft, nondistended, nontender, no rebound pain, no organomegaly, BS present Ext: No edema. 2+DP/PT pulse bilaterally Musculoskeletal: No joint deformities, erythema, or stiffness, ROM full Skin: No rashes.  Neuro: Alert and oriented X3, cranial nerves II-XII grossly intact, muscle strength 5/5 in upper extremities, 3/5 in lower extremities bilaterally.  Decreased sensation to light touch intact in legs bilaterally (R>L). Brachial reflex 2+ bilaterally. Knee reflex 3+ bilaterally. Positive Babinski's sign on the lefe. Normal finger to nose test. Unable to do heel-to-shin test Psych: Patient is not psychotic, no suicidal or hemocidal ideation.  Lab results: Basic Metabolic Panel:  Recent Labs  40/98/1101/26/15 0706  NA 143  K 3.5*  CL 104  GLUCOSE 110*  BUN 11  CREATININE 0.80   Liver Function Tests: No results found for this basename: AST, ALT, ALKPHOS, BILITOT, PROT, ALBUMIN,  in the last 72 hours No results found for this basename: LIPASE, AMYLASE,  in the last 72 hours No results found for this basename: AMMONIA,  in the  last 72 hours CBC:  Recent Labs  05/25/13 0706  HGB 12.6  HCT 37.0   Cardiac Enzymes: No results found for this basename: CKTOTAL, CKMB, CKMBINDEX, TROPONINI,  in the last 72 hours BNP: No results found for this basename: PROBNP,  in the last 72 hours D-Dimer: No results found for this basename: DDIMER,  in the last 72 hours CBG: No results found for this basename: GLUCAP,  in the last 72 hours Hemoglobin A1C: No results found for this  basename: HGBA1C,  in the last 72 hours Fasting Lipid Panel: No results found for this basename: CHOL, HDL, LDLCALC, TRIG, CHOLHDL, LDLDIRECT,  in the last 72 hours Thyroid Function Tests: No results found for this basename: TSH, T4TOTAL, FREET4, T3FREE, THYROIDAB,  in the last 72 hours Anemia Panel: No results found for this basename: VITAMINB12, FOLATE, FERRITIN, TIBC, IRON, RETICCTPCT,  in the last 72 hours Coagulation: No results found for this basename: LABPROT, INR,  in the last 72 hours Urine Drug Screen: Drugs of Abuse  No results found for this basename: labopia,  cocainscrnur,  labbenz,  amphetmu,  thcu,  labbarb    Alcohol Level: No results found for this basename: ETH,  in the last 72 hours Urinalysis:  Recent Labs  05/25/13 0830  COLORURINE YELLOW  LABSPEC 1.016  PHURINE 6.5  GLUCOSEU NEGATIVE  HGBUR NEGATIVE  BILIRUBINUR NEGATIVE  KETONESUR NEGATIVE  PROTEINUR NEGATIVE  UROBILINOGEN 1.0  NITRITE NEGATIVE  LEUKOCYTESUR NEGATIVE   Misc. Labs:   Imaging results:  No results found.  Other results:  EKG:   Assessment & Plan by Problem: Active Problems:  #: Multiple sclerosis exacerbation: Patient's symptoms are most likely caused by MS exacerbation. Other differential diagnoses include, but less likely, stroke (less likely given her bilateral leg weakness), and Guiline-Barr syndrome (unlikely, given her increased knee reflexes). Patient was diagnosed with multiple sclerosis 2006. She had history of relapsing and  remitting in the past. She has been followed up by neurologist, Dr. Terrace Arabia. Last seen was 10/60/14. She was started with Gilenya (PREFERR trial) on 03/20/12. She has been tolerating the medication well per Dr. Zannie Cove note. Last MRI of brain on 02/12/13 showed multiple discrete and confluent periventricular, subcortical, brainstem and cerebellar chronic demyelinating plaques. No enhancing lesions are noted. Currently patient is hemodynamically. Airway is protected, no shortness of breath. Oxygen saturation is 100% on room air. Neurology was consulted and think this is MS flair, recommended Solumedrol 1 gram daily for 5 days   -will admit to med-surg bed -Neuro check q4h -Appreciate neurology's consultation in management of a patient, will f/u recommendations.  -will continue Solumedrol 1 gram daily for 5 days and Protonix 40 mg IV daily for GI protection per Neuro -pain control: Percocet -pt/ot  #: Cough: mild dry cough for 3 days. No fever, chills, shortness of breath, chest pain. Likely mild upper respiratory viral infection. -will check CXR  #  F/E/N  -SL:   -Eelectrolytes: K3.5-->20 mEq of KCl oral, BMP in AM -Diet: regular  # DVT px: Heparin sq   Dispo: Disposition is deferred at this time, awaiting improvement of current medical problems. Anticipated discharge in approximately 2 to 3 day(s).   The patient does have a current PCP Levert Feinstein, MD), therefore is requiring [facility]PC follow-up after discharge.   The patient does not have transportation limitations that hinder transportation to clinic appointments.  Signed:  Lorretta Harp, MD PGY3, Internal Medicine Teaching Service Pager: [phone]  05/25/2013, 11:21 AM

## 2013-05-25 NOTE — ED Provider Notes (Signed)
CSN: [id number]     Arrival date & time 05/25/13  0547 History   First MD Initiated Contact with Patient 05/25/13 [phone]     Chief Complaint  Patient presents with  . Numbness   (Consider location/radiation/quality/duration/timing/severity/associated sxs/prior Treatment) HPI Patient presents emergency department with numbness below both knees.  The patient, states, that she has a history of MS and has had numbness in her lower extremities.  In the past, but never felt that in her bilateral lower legs simultaneously.  The patient, states she normally has numbness in her feet.  Patient denies chest pain, shortness of breath, abdominal pain, back pain, nausea, vomiting, diarrhea, fever, weakness, dizziness, blurred vision, headache, or syncope.  The patient, states she did not take any medications prior to arrival.  She states when she got up this morning.  She had a difficult time walking.  Patient is complaining of burning and pain in both legs Past Medical History  Diagnosis Date  . MS (multiple sclerosis) 2006  . Anemia   . Hypertension    Past Surgical History  Procedure Laterality Date  . Abdominal hysterectomy    . Hernia repair    . Wisedom teeth removed     Family History  Problem Relation Age of Onset  . Vision loss Mother     due to glucoma in one eye  . Diverticulitis Mother   . Glaucoma Mother   . Alcohol abuse Father   . Hypertension Sister   . Hypertension Brother   . Diverticulosis Brother   . Hypertension Brother   . Vision loss Brother     due to glucome in one eye  . Glaucoma Brother    History  Substance Use Topics  . Smoking status: Never Smoker   . Smokeless tobacco: Not on file  . Alcohol Use: No   OB History   Grav Para Term Preterm Abortions TAB SAB Ect Mult Living                 Review of Systems All other systems negative except as documented in the HPI. All pertinent positives and negatives as reviewed in the HPI.  Allergies  Review of  patient's allergies indicates no known allergies.  Home Medications   Current Outpatient Rx  Name  Route  Sig  Dispense  Refill  . cholecalciferol (VITAMIN D) 1000 UNITS tablet   Oral   Take 1,000 Units by mouth daily.         . Fingolimod HCl (GILENYA) 0.5 MG CAPS   Oral   Take 1 capsule by mouth daily.         Marland Kitchen ibuprofen (ADVIL,MOTRIN) 200 MG tablet   Oral   Take 600 mg by mouth every 6 (six) hours as needed for headache, mild pain or moderate pain.         . vitamin C (ASCORBIC ACID) 500 MG tablet   Oral   Take 500 mg by mouth daily.          BP 138/75  Pulse 88  Temp(Src) 98.4 F (36.9 C) (Oral)  Resp 18  SpO2 98% Physical Exam  Nursing note and vitals reviewed. Constitutional: She is oriented to person, place, and time. She appears well-developed and well-nourished. No distress.  HENT:  Head: Normocephalic and atraumatic.  Mouth/Throat: Oropharynx is clear and moist.  Eyes: Pupils are equal, round, and reactive to light.  Neck: Normal range of motion. Neck supple.  Cardiovascular: Normal rate, regular rhythm and intact  distal pulses.  Exam reveals no gallop and no friction rub.   No murmur heard. Pulmonary/Chest: Effort normal and breath sounds normal.  Neurological: She is alert and oriented to person, place, and time. She has normal strength and normal reflexes. She exhibits normal muscle tone. Coordination normal. GCS eye subscore is 4. GCS verbal subscore is 5. GCS motor subscore is 6.  Patient has gross sensation intact.    ED Course  Procedures (including critical care time) Labs Review Labs Reviewed  POCT I-STAT, CHEM 8 - Abnormal; Notable for the following:    Potassium 3.5 (*)    Glucose, Bld 110 (*)    All other components within normal limits  URINALYSIS, ROUTINE W REFLEX MICROSCOPIC   Patient has been stable here in the emergency department should be referred back to her primary care Dr. for further evaluation and management.  Patient is  advised to increase her fluid intake. Patient will be placed on oral methylprednisolone. Follow up with her neurologist  Carlyle DollyChristopher W Gwendy Boeder, PA-C 05/25/13 [phone]

## 2013-05-25 NOTE — ED Notes (Signed)
PA made aware pt still c/o pain in her legs

## 2013-05-25 NOTE — ED Notes (Signed)
Pt reports she doesn't have a PCP at this time.

## 2013-05-25 NOTE — ED Provider Notes (Signed)
Medical screening examination/treatment/procedure(s) were performed by non-physician practitioner and as supervising physician I was immediately available for consultation/collaboration.  EKG Interpretation   None         Lyanne CoKevin M Taliah Porche, MD 05/25/13 1012

## 2013-05-25 NOTE — ED Notes (Signed)
Attempted to assist pt into wheelchair, pt unable to stand on her own. Attempted with a walker, pt reports she just can't feel her legs well and barely stands up then sits back down. Pt instructed not to sit down until she can feel the bed behind her with her legs and/or arms. Pt also told not to attempt to get up until staff is there to assist her. PA made aware.

## 2013-05-26 ENCOUNTER — Encounter (HOSPITAL_COMMUNITY): Payer: Self-pay | Admitting: *Deleted

## 2013-05-26 DIAGNOSIS — D72819 Decreased white blood cell count, unspecified: Secondary | ICD-10-CM

## 2013-05-26 DIAGNOSIS — D649 Anemia, unspecified: Secondary | ICD-10-CM

## 2013-05-26 DIAGNOSIS — G35 Multiple sclerosis: Secondary | ICD-10-CM

## 2013-05-26 DIAGNOSIS — I10 Essential (primary) hypertension: Secondary | ICD-10-CM

## 2013-05-26 DIAGNOSIS — R05 Cough: Secondary | ICD-10-CM

## 2013-05-26 DIAGNOSIS — R059 Cough, unspecified: Secondary | ICD-10-CM

## 2013-05-26 DIAGNOSIS — I517 Cardiomegaly: Secondary | ICD-10-CM

## 2013-05-26 LAB — BASIC METABOLIC PANEL
BUN: 15 mg/dL (ref 6–23)
CALCIUM: 9.5 mg/dL (ref 8.4–10.5)
CHLORIDE: 103 meq/L (ref 96–112)
CO2: 24 mEq/L (ref 19–32)
CREATININE: 0.75 mg/dL (ref 0.50–1.10)
GFR calc Af Amer: >90 mL/min (ref >90)
GFR calc non Af Amer: >90 mL/min (ref >90)
GLUCOSE: 136 mg/dL — AB (ref 70–99)
Potassium: 3.9 mEq/L (ref 3.7–5.3)
Sodium: 141 mEq/L (ref 137–147)

## 2013-05-26 MED ORDER — PANTOPRAZOLE SODIUM 40 MG PO TBEC
40.0000 mg | DELAYED_RELEASE_TABLET | Freq: Every day | ORAL | Status: DC
  Administered 2013-05-26 – 2013-05-27 (×2): 40 mg via ORAL
  Filled 2013-05-26: qty 1

## 2013-05-26 MED ORDER — GUAIFENESIN 100 MG/5ML PO SYRP
200.0000 mg | ORAL_SOLUTION | Freq: Three times a day (TID) | ORAL | Status: DC | PRN
  Administered 2013-05-26: 200 mg via ORAL
  Filled 2013-05-26: qty 10

## 2013-05-26 NOTE — Progress Notes (Addendum)
Subjective:  Pt seen and examined in AM. No acute events overnight. Pt reports she is feeling better. She reports improved weakness and sensation in her legs. She is still not at her baseline and cannot ambulate without aid of a walker. She denies change in vision, headache, urinary incontinence, constipation, nausea, vomiting, abdominal pain, or muscle spasms.    Objective: Vital signs in last 24 hours: Filed Vitals:   05/25/13 1526 05/25/13 1640 05/25/13 2130 05/26/13 0550  BP: 168/88 159/87 165/92 153/101  Pulse: 80 84 79 74  Temp: 99 F (37.2 C) 98.3 F (36.8 C) 97.6 F (36.4 C) 97.8 F (36.6 C)  TempSrc: Oral Oral Oral Oral  Resp: 18 20 20 20   Height:  5\' 6"  (1.676 m)    Weight:  112 kg (246 lb 14.6 oz)    SpO2: 98% 98% 100% 100%   Weight change:   Intake/Output Summary (Last 24 hours) at 05/26/13 1439 Last data filed at 05/26/13 1400  Gross per 24 hour  Intake     58 ml  Output    240 ml  Net   -182 ml    General: Not in acute distress  HEENT: PERRL, EOMI. There is horizontal nystagmus bilaterally with difficulty with adduction. no scleral icterus, No JVD or bruit  Cardiac: S1/S2, RRR, No murmurs, gallops or rubs  Pulm: Good air movement bilaterally. Clear to auscultation bilaterally. No rales, wheezing, rhonchi or rubs.  Abd: Soft, nondistended, nontender, no rebound pain, no organomegaly, BS present  Ext: No edema. 2+DP/PT pulse bilaterally  Musculoskeletal: No joint deformities, erythema, or stiffness, ROM full  Skin: No rashes.  Neuro: Alert and oriented X3, cranial nerves II-XII grossly intact, Normal muscle strength 5/5 in upper and lower extremities. Decreased sensation to light touch intact in legs bilaterally. Hyperreflexic. Ambulates with walker Psych: Patient is not psychotic, no suicidal or hemocidal ideation.   Lab Results: Basic Metabolic Panel:  Recent Labs Lab 05/25/13 0706 05/26/13 0522  NA 143 141  K 3.5* 3.9  CL 104 103  CO2  --  24    GLUCOSE 110* 136*  BUN 11 15  CREATININE 0.80 0.75  CALCIUM  --  9.5   Liver Function Tests:  Recent Labs Lab 05/25/13 2027  AST 33  ALT 35  ALKPHOS 108  BILITOT <0.2*  PROT 7.6  ALBUMIN 3.5   No results found for this basename: LIPASE, AMYLASE,  in the last 168 hours No results found for this basename: AMMONIA,  in the last 168 hours CBC:  Recent Labs Lab 05/25/13 0706 05/25/13 2027  WBC  --  3.4*  NEUTROABS  --  3.2  HGB 12.6 11.7*  HCT 37.0 35.2*  MCV  --  88.4  PLT  --  264   Cardiac Enzymes: No results found for this basename: CKTOTAL, CKMB, CKMBINDEX, TROPONINI,  in the last 168 hours BNP: No results found for this basename: PROBNP,  in the last 168 hours D-Dimer: No results found for this basename: DDIMER,  in the last 168 hours CBG: No results found for this basename: GLUCAP,  in the last 168 hours Hemoglobin A1C: No results found for this basename: HGBA1C,  in the last 168 hours Fasting Lipid Panel: No results found for this basename: CHOL, HDL, LDLCALC, TRIG, CHOLHDL, LDLDIRECT,  in the last 168 hours Thyroid Function Tests: No results found for this basename: TSH, T4TOTAL, FREET4, T3FREE, THYROIDAB,  in the last 168 hours Coagulation: No results found for this  basename: LABPROT, INR,  in the last 168 hours Anemia Panel: No results found for this basename: VITAMINB12, FOLATE, FERRITIN, TIBC, IRON, RETICCTPCT,  in the last 168 hours Urine Drug Screen: Drugs of Abuse  No results found for this basename: labopia, cocainscrnur, labbenz, amphetmu, thcu, labbarb    Alcohol Level: No results found for this basename: ETH,  in the last 168 hours Urinalysis:  Recent Labs Lab 05/25/13 0830  COLORURINE YELLOW  LABSPEC 1.016  PHURINE 6.5  GLUCOSEU NEGATIVE  HGBUR NEGATIVE  BILIRUBINUR NEGATIVE  KETONESUR NEGATIVE  PROTEINUR NEGATIVE  UROBILINOGEN 1.0  NITRITE NEGATIVE  LEUKOCYTESUR NEGATIVE    Micro Results: No results found for this or any  previous visit (from the past 240 hour(s)). Studies/Results: X-ray Chest Pa And Lateral   05/25/2013   CLINICAL DATA:  Cough.  EXAM: CHEST  2 VIEW  COMPARISON:  Chest x-ray 02/05/2012.  FINDINGS: Pulmonary venous congestion without frank pulmonary edema. No consolidative airspace disease. No pleural effusions. Heart size appears mildly enlarged. Upper mediastinal contours are within normal limits. Atherosclerosis in the thoracic aorta.  IMPRESSION: 1. Mild cardiomegaly with pulmonary venous congestion, but no frank pulmonary edema. 2. Atherosclerosis.   Electronically Signed   By: Trudie Reedaniel  Entrikin M.D.   On: 05/25/2013 19:23   Medications: I have reviewed the patient's current medications. Scheduled Meds: . cholecalciferol  1,000 Units Oral Daily  . Fingolimod HCl  1 capsule Oral Daily  . heparin  5,000 Units Subcutaneous Q8H  . methylPREDNISolone (SOLU-MEDROL) injection  1,000 mg Intravenous Daily  . pantoprazole  40 mg Oral Daily  . vitamin C  500 mg Oral Daily   Continuous Infusions:  PRN Meds:.ibuprofen, oxyCODONE-acetaminophen  Assessment: 53 year old woman with past medical history of hypertension and MS who presented on 1/26 with acute flare of MS.   Plan:  Multiple Sclerosis Flare - improving, however still not at baseline (requiring walker to ambulate). Pt presented with LE sensation loss and paresis with resulting fall.  -Frequent neuro checks -Appreciate neurology recommendations ---> no repeat imaging necessary -Continue IV 1 g Solumedrol Day 2/5, will need to ask if further steroid taper needed after completion of 5 day IV course -Appreciate  PT consult recommendations ---> CIR -Obtain OT consult -Appreciate PMR consult recommendations --> home health therapy  -Continue Gilenya 0.5 mg daily  -Continue PO protonix 40 mg daily for GI protection in setting of glucocorticosteroids  -Oxycodone-acetaminophen 4-325 mg Q 8 hr PRN pain -Pt will need follow-up appointment with Dr.  Terrace ArabiaYan   Hypertension - currently hypertensive. Pt not on home anti-hypertensives.  -Consider outpatient anti-hypertensive therapy   Normocytic Anemia - Pt with Hg of 11.7 on admission with no active bleeding or hemodynamic instability. Etiology unknown -Consider outpatient follow-up with anemia panel   Leukopenia -Pt with WBC of 3.4 most likely due to immunosuppression in setting of Gilenya.  -Monitor for infection   Mild cardiomegaly with pulmonary venous congestion - Seen on chest xray on 1/26. Pt not fluid overloaded.  -Consider pro-BNP, 2D-echo to evaluate for CHF  Dry Cough - improving. Etiology most likely due to viral URI. Chest xray without infectious etiology.  -Robitussin cough syrup as needed  Hypokalemia - resolved.  Pt with potassium of 3.5 on admission and received 20 mEq KCl on 1/26   Diet: Regular DVT Ppx: SQ Heparin TID Code: Full   Dispo: Disposition is deferred at this time, awaiting improvement of current medical problems.  Anticipated discharge in approximately 1 day(s).   The patient  does have a current PCP (Levert Feinstein, MD) and does need an [facility] hospital follow-up appointment after discharge.  The patient does not have transportation limitations that hinder transportation to clinic appointments.  .Services Needed at time of discharge: Y = Yes, Blank = No PT:   OT:   RN:   Equipment:   Other:     LOS: 1 day   Otis Brace, MD 05/26/2013, 2:39 PM

## 2013-05-26 NOTE — Evaluation (Signed)
Physical Therapy Evaluation Patient Details Name: Alexis MessingDeborah Dehn MRN: [id number] DOB: 1960-11-18 Today's Date: 05/26/2013 Time: [id number] PT Time Calculation (min): 18 min  PT Assessment / Plan / Recommendation History of Present Illness  pt presents with MS exacerbation.    Clinical Impression  Pt indicates some improvement from yesterday, however still with Bil LE weakness and decreased coordination.  Spoke with pt about potential need for continued therapies pending how quickly pt responds to medications.  If pt remains on acute may be able to progress enough to return home safely, otherwise may need to consider CIR for further therapies.  Will continue to follow.      PT Assessment  Patient needs continued PT services    Follow Up Recommendations  CIR (Pending progress)    Does the patient have the potential to tolerate intense rehabilitation      Barriers to Discharge Decreased caregiver support      Equipment Recommendations  None recommended by PT    Recommendations for Other Services OT consult;Rehab consult   Frequency Min 3X/week    Precautions / Restrictions Precautions Precautions: Fall Restrictions Weight Bearing Restrictions: No RLE Weight Bearing: Partial weight bearing LLE Weight Bearing: Partial weight bearing   Pertinent Vitals/Pain Indicate no pain.        Mobility  Bed Mobility Overal bed mobility: Needs Assistance Bed Mobility: Supine to Sit Supine to sit: Supervision;HOB elevated General bed mobility comments: pt needs bed rail and HOB elevated to complete without physical A.   Transfers Overall transfer level: Needs assistance Equipment used: Rolling walker (2 wheeled) Transfers: Sit to/from Stand Sit to Stand: Min assist General transfer comment: pt demos good technique, but increased effort needed to come to stand.  pt needs to use UEs to place L foot on floor 2/2 increased extensor tone.   Ambulation/Gait Ambulation/Gait assistance: Min  assist Ambulation Distance (Feet): 120 Feet Assistive device: Rolling walker (2 wheeled) Gait Pattern/deviations: Step-through pattern;Decreased stride length;Wide base of support;Trunk flexed;Shuffle General Gait Details: pt maintains a wide BOS for balance.  pt with mild tremors during mobility and indicates feeling her LEs fatigue more quickly than normal.      Exercises     PT Diagnosis: Difficulty walking;Generalized weakness  PT Problem List: Decreased strength;Decreased activity tolerance;Decreased balance;Decreased mobility;Decreased coordination;Decreased knowledge of use of DME;Impaired sensation;Impaired tone PT Treatment Interventions: DME instruction;Gait training;Functional mobility training;Therapeutic activities;Therapeutic exercise;Balance training;Neuromuscular re-education;Patient/family education     PT Goals(Current goals can be found in the care plan section) Acute Rehab PT Goals Patient Stated Goal: home PT Goal Formulation: With patient Time For Goal Achievement: 06/09/13 Potential to Achieve Goals: Good  Visit Information  Last PT Received On: 05/26/13 Assistance Needed: +1 History of Present Illness: pt presents with MS exacerbation.         Prior Functioning  Home Living Family/patient expects to be discharged to:: Private residence Living Arrangements: Non-relatives/Friends (pt states she has a "spiritual son".  ) Available Help at Discharge: Friend(s);Available PRN/intermittently Type of Home: House Home Access: Ramped entrance Home Layout: One level Home Equipment: Walker - 2 wheels;Cane - quad Prior Function Level of Independence: Independent with assistive device(s) Communication Communication: No difficulties    Cognition  Cognition Arousal/Alertness: Awake/alert Behavior During Therapy: WFL for tasks assessed/performed Overall Cognitive Status: Within Functional Limits for tasks assessed    Extremity/Trunk Assessment Upper Extremity  Assessment Upper Extremity Assessment: Defer to OT evaluation Lower Extremity Assessment Lower Extremity Assessment: RLE deficits/detail;LLE deficits/detail RLE Deficits / Details: Strength grossly 4/5  with diminished sensation at baseline.   RLE Sensation: decreased light touch RLE Coordination: decreased fine motor LLE Deficits / Details: Strength grossly 4-/5 with increased extensor tone.  pt notes diminished sensation is baseline.   LLE Sensation: decreased light touch LLE Coordination: decreased fine motor   Balance Balance Overall balance assessment: Needs assistance Standing balance support: Bilateral upper extremity supported Standing balance-Leahy Scale: Fair Standing balance comment: pt relies heavily on UEs for support.    End of Session PT - End of Session Equipment Utilized During Treatment: Gait belt Activity Tolerance: Patient tolerated treatment well Patient left: in chair;with call bell/phone within reach Nurse Communication: Mobility status  GP     Sunny Schlein, [place] [phone] 05/26/2013, 1:44 PM

## 2013-05-26 NOTE — Progress Notes (Signed)
OT Cancellation Note  Patient Details Name: Alexis Cervantes MRN: [id number] DOB: 09/11/1960   Cancelled Treatment:      Reason Eval/Treat Not Completed: Patient not medically ready. Pt currently on strict bedrest. Please advance activity when appropriate for therapy evals.   Roselie AwkwardBarnhill, Chanah Tidmore Beth Dixon 05/26/2013, 7:57 AM

## 2013-05-26 NOTE — Progress Notes (Signed)
NEURO HOSPITALIST PROGRESS NOTE   SUBJECTIVE:                                                                                                                        Patient is feeling stronger today and states the decreased sensation in the left lower extremities has resolved.    OBJECTIVE:                                                                                                                           Vital signs in last 24 hours: Temp:  [97.6 F (36.4 C)-99 F (37.2 C)] 97.8 F (36.6 C) (01/27 0550) Pulse Rate:  [74-84] 74 (01/27 0550) Resp:  [18-20] 20 (01/27 0550) BP: (153-168)/(87-101) 153/101 mmHg (01/27 0550) SpO2:  [98 %-100 %] 100 % (01/27 0550) Weight:  [112 kg (246 lb 14.6 oz)] 112 kg (246 lb 14.6 oz) (01/26 1640)  Intake/Output from previous day: 01/26 0701 - 01/27 0700 In: -  Out: 240 [Urine:240] Intake/Output this shift:   Nutritional status: General  Past Medical History  Diagnosis Date  . MS (multiple sclerosis) 2006  . Anemia   . Hypertension      Neurologic Exam:  Mental Status: Alert, oriented, thought content appropriate.  Speech fluent without evidence of aphasia.  Able to follow 3 step commands without difficulty. Cranial Nerves: II:  Visual fields grossly normal, pupils equal, round, reactive to light and accommodation III,IV, VI: ptosis not present, extra-ocular motions intact bilaterally V,VII: smile symmetric, facial light touch sensation normal bilaterally VIII: hearing normal bilaterally IX,X: gag reflex present XI: bilateral shoulder shrug XII: midline tongue extension without atrophy or fasciculations  Motor: Right : Upper extremity   5/5    Left:     Upper extremity   5/5  Lower extremity   5/5     Lower extremity   4/5 Tone and bulk:normal tone throughout; no atrophy noted Sensory: Pinprick and light touch intact throughout, bilaterally Deep Tendon Reflexes:  Right: Upper Extremity    Left: Upper extremity   biceps (C-5 to C-6) 2/4   biceps (C-5 to C-6) 2/4 tricep (C7) 2/4    triceps (C7) 2/4 Brachioradialis (C6) 2/4  Brachioradialis (C6) 2/4  Lower Extremity Lower  Extremity  quadriceps (L-2 to L-4) 2/4   quadriceps (L-2 to L-4) 2/4 Achilles (S1) 1/4   Achilles (S1) 1/4  Plantars: Right: downgoing   Left: upgoing    Lab Results: Basic Metabolic Panel:  Recent Labs Lab 05/25/13 0706 05/26/13 0522  NA 143 141  K 3.5* 3.9  CL 104 103  CO2  --  24  GLUCOSE 110* 136*  BUN 11 15  CREATININE 0.80 0.75  CALCIUM  --  9.5    Liver Function Tests:  Recent Labs Lab 05/25/13 2027  AST 33  ALT 35  ALKPHOS 108  BILITOT <0.2*  PROT 7.6  ALBUMIN 3.5   No results found for this basename: LIPASE, AMYLASE,  in the last 168 hours No results found for this basename: AMMONIA,  in the last 168 hours  CBC:  Recent Labs Lab 05/25/13 0706 05/25/13 2027  WBC  --  3.4*  NEUTROABS  --  3.2  HGB 12.6 11.7*  HCT 37.0 35.2*  MCV  --  88.4  PLT  --  264    Cardiac Enzymes: No results found for this basename: CKTOTAL, CKMB, CKMBINDEX, TROPONINI,  in the last 168 hours  Lipid Panel: No results found for this basename: CHOL, TRIG, HDL, CHOLHDL, VLDL, LDLCALC,  in the last 168 hours  CBG: No results found for this basename: GLUCAP,  in the last 168 hours  Microbiology: No results found for this or any previous visit.  Coagulation Studies: No results found for this basename: LABPROT, INR,  in the last 72 hours  Imaging: X-ray Chest Pa And Lateral   05/25/2013   CLINICAL DATA:  Cough.  EXAM: CHEST  2 VIEW  COMPARISON:  Chest x-ray 02/05/2012.  FINDINGS: Pulmonary venous congestion without frank pulmonary edema. No consolidative airspace disease. No pleural effusions. Heart size appears mildly enlarged. Upper mediastinal contours are within normal limits. Atherosclerosis in the thoracic aorta.  IMPRESSION: 1. Mild cardiomegaly with pulmonary venous  congestion, but no frank pulmonary edema. 2. Atherosclerosis.   Electronically Signed   By: Trudie Reed M.D.   On: 05/25/2013 19:23       MEDICATIONS                                                                                                                        Scheduled: . cholecalciferol  1,000 Units Oral Daily  . Fingolimod HCl  1 capsule Oral Daily  . heparin  5,000 Units Subcutaneous Q8H  . methylPREDNISolone (SOLU-MEDROL) injection  1,000 mg Intravenous Daily  . pantoprazole  40 mg Oral Daily  . vitamin C  500 mg Oral Daily    ASSESSMENT/PLAN:  53 yo female with known MS on Gilenya with MS flair consisting of LE weakness left>right and left facial numbness. Facial numbness and LE decreased sensation has cleared.  She still has mild left LE weakness but able to ambulate without assistance with walker.   Recommend: 1) Continue Solumedrol.  She has received 2/5.    --This may be continued as out patient with home health nurse if PT feels she is safe to be discharged.    Assessment and plan discussed with with attending physician and they are in agreement.    Alexis MornDavid Tsutomu Barfoot PA-C [facility] [phone]  05/26/2013, 12:18 PM

## 2013-05-26 NOTE — Progress Notes (Signed)
Rehab Admissions Coordinator Note:  Patient was screened by Alexis Cervantes for appropriateness for an Inpatient Acute Rehab Consult.  At this time, we are recommending Inpatient Rehab consult pending her progress with therapy as she is on day 2/5 solumedrol.  Alexis Cervantes 05/26/2013, 2:19 PM  I can be reached at [phone].

## 2013-05-26 NOTE — H&P (Signed)
Internal Medicine Attending Admission Note Date: 05/26/2013  Patient name: Alexis Cervantes Medical record number: [id number] Date of birth: [birth year] Age: 53 y.o. Gender: female  I saw and evaluated the patient. I reviewed the resident's note and I agree with the resident's findings and plan as documented in the resident's note, with the following additional comments.  Chief Complaint(s): Leg weakness and numbness  History - key components related to admission: Patient is a 53 year old woman with history of multiple sclerosis managed by Dr. Terrace Arabia, currently on Gilenya (fingolimod), hypertension, and other problems as outlined in the medical history, admitted with complaint of bilateral leg weakness and numbness noted on the morning of admission.  Patient reports falling as a result of her weakness, but did not injure herself.   Physical Exam - key components related to admission:  Filed Vitals:   05/25/13 1526 05/25/13 1640 05/25/13 2130 05/26/13 0550  BP: 168/88 159/87 165/92 153/101  Pulse: 80 84 79 74  Temp: 99 F (37.2 C) 98.3 F (36.8 C) 97.6 F (36.4 C) 97.8 F (36.6 C)  TempSrc: Oral Oral Oral Oral  Resp: 18 20 20 20   Height:  5\' 6"  (1.676 m)    Weight:  246 lb 14.6 oz (112 kg)    SpO2: 98% 98% 100% 100%   General: Alert, no acute distress Lungs: Clear Heart: Regular; no extra sounds or murmurs Abdomen: Bowel sounds present, soft, nontender Extremities: No edema Neurologic: Alert and oriented; 4/5 left lower strength  Lab results:   Basic Metabolic Panel:  Recent Labs  00/17/49 0706 05/26/13 0522  NA 143 141  K 3.5* 3.9  CL 104 103  CO2  --  24  GLUCOSE 110* 136*  BUN 11 15  CREATININE 0.80 0.75  CALCIUM  --  9.5    Liver Function Tests:  Recent Labs  05/25/13 2027  AST 33  ALT 35  ALKPHOS 108  BILITOT <0.2*  PROT 7.6  ALBUMIN 3.5     CBC:  Recent Labs  05/25/13 0706 05/25/13 2027  WBC  --  3.4*  HGB 12.6 11.7*  HCT 37.0 35.2*  MCV   --  88.4  PLT  --  264    Recent Labs  05/25/13 2027  NEUTROABS 3.2  LYMPHSABS 0.2*  MONOABS 0.0*  EOSABS 0.0  BASOSABS 0.0      Urinalysis    Component Value Date/Time   COLORURINE YELLOW 05/25/2013 0830   APPEARANCEUR CLEAR 05/25/2013 0830   LABSPEC 1.016 05/25/2013 0830   PHURINE 6.5 05/25/2013 0830   GLUCOSEU NEGATIVE 05/25/2013 0830   HGBUR NEGATIVE 05/25/2013 0830   BILIRUBINUR NEGATIVE 05/25/2013 0830   KETONESUR NEGATIVE 05/25/2013 0830   PROTEINUR NEGATIVE 05/25/2013 0830   UROBILINOGEN 1.0 05/25/2013 0830   NITRITE NEGATIVE 05/25/2013 0830   LEUKOCYTESUR NEGATIVE 05/25/2013 0830    Imaging results:  X-ray Chest Pa And Lateral   05/25/2013   CLINICAL DATA:  Cough.  EXAM: CHEST  2 VIEW  COMPARISON:  Chest x-ray 02/05/2012.  FINDINGS: Pulmonary venous congestion without frank pulmonary edema. No consolidative airspace disease. No pleural effusions. Heart size appears mildly enlarged. Upper mediastinal contours are within normal limits. Atherosclerosis in the thoracic aorta.  IMPRESSION: 1. Mild cardiomegaly with pulmonary venous congestion, but no frank pulmonary edema. 2. Atherosclerosis.   Electronically Signed   By: Trudie Reed M.D.   On: 05/25/2013 19:23    Other results: EKG: Normal sinus rhythm; moderate voltage criteria for LVH, may be normal variant; nonspecific T  wave abnormality  Assessment & Plan by Problem:  1.  Acute multiple sclerosis exacerbation.  Neurology consult saw patient, and recommended IV Solu-Medrol 1 g daily for 5 days.  Patient reports some improvement overnight, but is still not back to baseline.  I discussed patient with physical therapist, who does not feel that patient is ready for discharge home.  Plan is continue IV Solu-Medrol as inpatient; continue OT/PT; follow neurological status.  2.  Other problems and plans as per the resident physician's note.

## 2013-05-26 NOTE — Consult Note (Signed)
Physical Medicine and Rehabilitation Consult Reason for Consult: MS exacerbation Referring Physician: [facility]    HPI: Alexis Cervantes is a 53 y.o. right-handed female with history of multiple sclerosis diagnosed in 2006 followed by neurology Dr. Terrace Arabia. Patient independent prior to admission using a cane at times. Admitted 05/25/2013 increasing weakness and decreased sensation in her legs. Patient takes Gilenya for multiple sclerosis at home and has not missed any recent doses. Has intermittent blurred vision and dizziness. Neurology services consulted suspect MS exacerbation placed on Solu-Medrol x5 days initiated 05/25/2013. Subcutaneous heparin added for DVT prophylaxis. Physical occupational therapy evaluations completed with recommendations of physical medicine rehabilitation consult to consider inpatient rehabilitation services   Review of Systems  Eyes: Positive for blurred vision.  Neurological: Positive for tingling and weakness.  All other systems reviewed and are negative.   Past Medical History  Diagnosis Date  . MS (multiple sclerosis) 2006  . Anemia   . Hypertension    Past Surgical History  Procedure Laterality Date  . Abdominal hysterectomy    . Hernia repair    . Wisedom teeth removed     Family History  Problem Relation Age of Onset  . Vision loss Mother     due to glucoma in one eye  . Diverticulitis Mother   . Glaucoma Mother   . Alcohol abuse Father   . Hypertension Sister   . Hypertension Brother   . Diverticulosis Brother   . Hypertension Brother   . Vision loss Brother     due to glucome in one eye  . Glaucoma Brother    Social History:  reports that she has never smoked. She does not have any smokeless tobacco history on file. She reports that she does not drink alcohol or use illicit drugs. Allergies: No Known Allergies Medications Prior to Admission  Medication Sig Dispense Refill  . cholecalciferol (VITAMIN D) 1000 UNITS tablet Take 1,000  Units by mouth daily.      . Fingolimod HCl (GILENYA) 0.5 MG CAPS Take 1 capsule by mouth daily.      Marland Kitchen ibuprofen (ADVIL,MOTRIN) 200 MG tablet Take 600 mg by mouth every 6 (six) hours as needed for headache, mild pain or moderate pain.      . vitamin C (ASCORBIC ACID) 500 MG tablet Take 500 mg by mouth daily.        Home: Home Living Family/patient expects to be discharged to:: Private residence Living Arrangements: Non-relatives/Friends (pt states she has a "spiritual son".  ) Available Help at Discharge: Friend(s);Available PRN/intermittently Type of Home: House Home Access: Ramped entrance Home Layout: One level Home Equipment: Walker - 2 wheels;Cane - quad  Functional History:   Functional Status:  Mobility:     Ambulation/Gait Ambulation Distance (Feet): 120 Feet General Gait Details: pt maintains a wide BOS for balance.  pt with mild tremors during mobility and indicates feeling her LEs fatigue more quickly than normal.      ADL:    Cognition: Cognition Overall Cognitive Status: Within Functional Limits for tasks assessed Orientation Level: Oriented X4 Cognition Arousal/Alertness: Awake/alert Behavior During Therapy: WFL for tasks assessed/performed Overall Cognitive Status: Within Functional Limits for tasks assessed  Blood pressure 159/80, pulse 79, temperature 98.2 F (36.8 C), temperature source Oral, resp. rate 18, height 5\' 6"  (1.676 m), weight 112 kg (246 lb 14.6 oz), SpO2 100.00%. Physical Exam  Vitals reviewed. Constitutional: She is oriented to person, place, and time.  HENT:  Head: Normocephalic.  Eyes:  EOM are normal.  Negative nystagmus  Neck: Normal range of motion. Neck supple. No thyromegaly present.  Cardiovascular: Normal rate and regular rhythm.   Respiratory: Effort normal and breath sounds normal. No respiratory distress.  GI: Soft. Bowel sounds are normal. She exhibits no distension.  Musculoskeletal: She exhibits no edema.    Neurological: She is alert and oriented to person, place, and time.  Follows full commands. Mild decreased fine motor skills. Speech is fluent. Fair standing balance. Romberg Positive. Decreased LT in finger tips and below knees to soles of feet (greatest at feet). Strength nearly symmetrical. Good insight and awareness  Skin: Skin is warm and dry.  Psychiatric: She has a normal mood and affect. Her behavior is normal. Judgment and thought content normal.    Results for orders placed during the hospital encounter of 05/25/13 (from the past 24 hour(s))  HEPATIC FUNCTION PANEL     Status: Abnormal   Collection Time    05/25/13  8:27 PM      Result Value Range   Total Protein 7.6  6.0 - 8.3 g/dL   Albumin 3.5  3.5 - 5.2 g/dL   AST 33  0 - 37 U/L   ALT 35  0 - 35 U/L   Alkaline Phosphatase 108  39 - 117 U/L   Total Bilirubin <0.2 (*) 0.3 - 1.2 mg/dL   Bilirubin, Direct <9.2  0.0 - 0.3 mg/dL   Indirect Bilirubin NOT CALCULATED  0.3 - 0.9 mg/dL  CBC WITH DIFFERENTIAL     Status: Abnormal   Collection Time    05/25/13  8:27 PM      Result Value Range   WBC 3.4 (*) 4.0 - 10.5 K/uL   RBC 3.98  3.87 - 5.11 MIL/uL   Hemoglobin 11.7 (*) 12.0 - 15.0 g/dL   HCT 01.0 (*) 07.1 - 21.9 %   MCV 88.4  78.0 - 100.0 fL   MCH 29.4  26.0 - 34.0 pg   MCHC 33.2  30.0 - 36.0 g/dL   RDW 75.8  83.2 - 54.9 %   Platelets 264  150 - 400 K/uL   Neutrophils Relative % 94 (*) 43 - 77 %   Neutro Abs 3.2  1.7 - 7.7 K/uL   Lymphocytes Relative 5 (*) 12 - 46 %   Lymphs Abs 0.2 (*) 0.7 - 4.0 K/uL   Monocytes Relative 1 (*) 3 - 12 %   Monocytes Absolute 0.0 (*) 0.1 - 1.0 K/uL   Eosinophils Relative 0  0 - 5 %   Eosinophils Absolute 0.0  0.0 - 0.7 K/uL   Basophils Relative 0  0 - 1 %   Basophils Absolute 0.0  0.0 - 0.1 K/uL  BASIC METABOLIC PANEL     Status: Abnormal   Collection Time    05/26/13  5:22 AM      Result Value Range   Sodium 141  137 - 147 mEq/L   Potassium 3.9  3.7 - 5.3 mEq/L   Chloride 103   96 - 112 mEq/L   CO2 24  19 - 32 mEq/L   Glucose, Bld 136 (*) 70 - 99 mg/dL   BUN 15  6 - 23 mg/dL   Creatinine, Ser 8.26  0.50 - 1.10 mg/dL   Calcium 9.5  8.4 - 41.5 mg/dL   GFR calc non Af Amer >90  >90 mL/min   GFR calc Af Amer >90  >90 mL/min   X-ray Chest Pa And Lateral  05/25/2013   CLINICAL DATA:  Cough.  EXAM: CHEST  2 VIEW  COMPARISON:  Chest x-ray 02/05/2012.  FINDINGS: Pulmonary venous congestion without frank pulmonary edema. No consolidative airspace disease. No pleural effusions. Heart size appears mildly enlarged. Upper mediastinal contours are within normal limits. Atherosclerosis in the thoracic aorta.  IMPRESSION: 1. Mild cardiomegaly with pulmonary venous congestion, but no frank pulmonary edema. 2. Atherosclerosis.   Electronically Signed   By: Trudie Reedaniel  Entrikin M.D.   On: 05/25/2013 19:23    Assessment/Plan: Diagnosis: MS exacerbation 1. Does the need for close, 24 hr/day medical supervision in concert with the patient's rehab needs make it unreasonable for this patient to be served in a less intensive setting? No and Potentially 2. Co-Morbidities requiring supervision/potential complications: htn 3. Due to bladder management, bowel management, safety, skin/wound care, disease management and medication administration, does the patient require 24 hr/day rehab nursing? Potentially 4. Does the patient require coordinated care of a physician, rehab nurse, PT,OT to address physical and functional deficits in the context of the above medical diagnosis(es)? No Addressing deficits in the following areas: balance, endurance, locomotion, strength, transferring, bowel/bladder control, bathing, dressing, feeding, grooming, toileting and psychosocial support 5. Can the patient actively participate in an intensive therapy program of at least 3 hrs of therapy per day at least 5 days per week? Yes and Potentially 6. The potential for patient to make measurable gains while on inpatient rehab  is fair 7. Anticipated functional outcomes upon discharge from inpatient rehab are mod I with PT, mod I with OT, na with SLP. 8. Estimated rehab length of stay to reach the above functional goals is: likely not needed 9. Does the patient have adequate social supports to accommodate these discharge functional goals? Yes 10. Anticipated D/C setting: Home 11. Anticipated post D/C treatments: HH therapy 12. Overall Rehab/Functional Prognosis: excellent  RECOMMENDATIONS: This patient's condition is appropriate for continued rehabilitative care in the following setting: [facility] Patient has agreed to participate in recommended program. Yes and Potentially Note that insurance prior authorization may be required for reimbursement for recommended care.  Comment: Pt is doing fairly well on hospital day #2. I project that she will nearly mod I over the next 3 days by the time her solumedrol is complete. She has a "spiritual" son at home who can assist as well.   Ranelle OysterZachary T. Swartz, MD, [facility]     05/26/2013

## 2013-05-26 NOTE — Progress Notes (Signed)
PT Cancellation Note  Patient Details Name: Alexis Cervantes MRN: [id number] DOB: Oct 26, 1960   Cancelled Treatment:    Reason Eval/Treat Not Completed: Patient not medically ready.  Pt currently on strict bedrest.  Please advance activity when appropriate for therapy evals.     Sunny Schlein, [place] [phone] 05/26/2013, 7:39 AM

## 2013-05-27 LAB — PRO B NATRIURETIC PEPTIDE: Pro B Natriuretic peptide (BNP): 197 pg/mL — ABNORMAL HIGH (ref 0–125)

## 2013-05-27 MED ORDER — PREDNISONE 5 MG PO TABS: ORAL_TABLET | ORAL | Status: DC

## 2013-05-27 NOTE — Progress Notes (Signed)
Patient making good progress with therapy, I will follow her progress as today is day 3 of 5 solumedrol. Likely can go home with HH once solumedrol completed, but I will follow in case she does not progress as expected. [phone]

## 2013-05-27 NOTE — Progress Notes (Signed)
NEURO HOSPITALIST PROGRESS NOTE   SUBJECTIVE:                                                                                                                        Feeling better today. Stated that her legs are stronger but not quite to her baseline yet. IV solumedrol day # 3 today. No other neurological complains.  OBJECTIVE:                                                                                                                           Vital signs in last 24 hours: Temp:  [98 F (36.7 C)-98.5 F (36.9 C)] 98.5 F (36.9 C) (01/28 0514) Pulse Rate:  [73-79] 73 (01/28 0514) Resp:  [18-20] 20 (01/28 0514) BP: (155-162)/(80-95) 162/90 mmHg (01/28 0522) SpO2:  [100 %] 100 % (01/28 0514)  Intake/Output from previous day: 01/27 0701 - 01/28 0700 In: 118 [P.O.:60; IV Piggyback:58] Out: -  Intake/Output this shift: Total I/O In: 150 [P.O.:150] Out: -  Nutritional status: General  Past Medical History  Diagnosis Date  . MS (multiple sclerosis) 2006  . Anemia   . Hypertension    Neurologic Exam:  Mental Status:  Alert, oriented, thought content appropriate. Speech fluent without evidence of aphasia. Able to follow 3 step commands without difficulty.  Cranial Nerves:  II: Visual fields grossly normal, pupils equal, round, reactive to light and accommodation  III,IV, VI: ptosis not present, extra-ocular motions intact bilaterally  V,VII: smile symmetric, facial light touch sensation normal bilaterally  VIII: hearing normal bilaterally  IX,X: gag reflex present  XI: bilateral shoulder shrug  XII: midline tongue extension without atrophy or fasciculations  Motor:  Right : Upper extremity 5/5 Left: Upper extremity 5/5  Lower extremity 5/5 Lower extremity 4/5  Tone and bulk:normal tone throughout; no atrophy noted  Sensory: Pinprick and light touch intact throughout, bilaterally  Deep Tendon Reflexes:  Right: Upper Extremity Left:  Upper extremity  biceps (C-5 to C-6) 2/4 biceps (C-5 to C-6) 2/4  tricep (C7) 2/4 triceps (C7) 2/4  Brachioradialis (C6) 2/4 Brachioradialis (C6) 2/4  Lower Extremity Lower Extremity  quadriceps (L-2 to L-4) 2/4 quadriceps (L-2 to L-4) 2/4  Achilles (S1) 1/4 Achilles (S1) 1/4  Plantars:  Right: downgoing Left: upgoing   Lab Results: No results found for this basename: cbc, bmp, coags, chol, tri, ldl, hga1c   Lipid Panel No results found for this basename: CHOL, TRIG, HDL, CHOLHDL, VLDL, LDLCALC,  in the last 72 hours  Studies/Results: X-ray Chest Pa And Lateral   05/25/2013   CLINICAL DATA:  Cough.  EXAM: CHEST  2 VIEW  COMPARISON:  Chest x-ray 02/05/2012.  FINDINGS: Pulmonary venous congestion without frank pulmonary edema. No consolidative airspace disease. No pleural effusions. Heart size appears mildly enlarged. Upper mediastinal contours are within normal limits. Atherosclerosis in the thoracic aorta.  IMPRESSION: 1. Mild cardiomegaly with pulmonary venous congestion, but no frank pulmonary edema. 2. Atherosclerosis.   Electronically Signed   By: Trudie Reed M.D.   On: 05/25/2013 19:23    MEDICATIONS                                                                                                                       I have reviewed the patient's current medications.  ASSESSMENT/PLAN:                                                                                                           RR-MS admitted with acute exacerbation characterized by bilat. LE weakness and inability to walk. Much improved and able to walk with a walker. She will be finishing day 3 of IV solumedrol today, and agree that she can be discharge home with oral taper of prednisone as follows: 30 mg x 3 days, 20 mg x 2 days, 5 mg x 2 days, 5 mg x 1 day, and then stop prednisone. Outpatient neurology follow up in 2-3 weeks.  Wyatt Portela, MD [facility] [phone]  05/27/2013, 9:38 AM

## 2013-05-27 NOTE — Progress Notes (Signed)
Subjective:  Pt seen and examined in AM. No acute events overnight. She reports she is better than when she admitted to the hospital. She still has bilateral decreased sensation but weakness is improving. She is beginning to be able to ambulate without use of walker, but reports still not back to baseline. She still has dry cough with some relief with cough syrup.   Objective: Vital signs in last 24 hours: Filed Vitals:   05/26/13 2117 05/27/13 0514 05/27/13 0522 05/27/13 1415  BP: 155/80 156/95 162/90 150/84  Pulse: 75 73  75  Temp: 98 F (36.7 C) 98.5 F (36.9 C)  98.2 F (36.8 C)  TempSrc: Oral Oral  Oral  Resp: 18 20  18   Height:      Weight:      SpO2: 100% 100%  99%   Weight change:   Intake/Output Summary (Last 24 hours) at 05/27/13 1559 Last data filed at 05/27/13 1416  Gross per 24 hour  Intake    432 ml  Output      0 ml  Net    432 ml    General: Not in acute distress  HEENT: PERRL, EOMI. There is horizontal nystagmus bilaterally with difficulty with adduction. no scleral icterus, No JVD or bruit  Cardiac: S1/S2, RRR, No murmurs, gallops or rubs  Pulm: Good air movement bilaterally. Clear to auscultation bilaterally. No rales, wheezing, rhonchi or rubs.  Abd: Soft, nondistended, nontender, no rebound pain, no organomegaly, BS present  Ext: No edema. 2+DP/PT pulse bilaterally  Musculoskeletal: No joint deformities, erythema, or stiffness, ROM full  Skin: No rashes.  Neuro: Alert and oriented X3, cranial nerves II-XII grossly intact, Normal muscle strength 5/5 in upper and lower extremities. Decreased sensation to light touch intact in legs bilaterally. Hyperreflexic. Ambulates with walker Psych: Patient is not psychotic, no suicidal or hemocidal ideation.   Lab Results: Basic Metabolic Panel:  Recent Labs Lab 05/25/13 0706 05/26/13 0522  NA 143 141  K 3.5* 3.9  CL 104 103  CO2  --  24  GLUCOSE 110* 136*  BUN 11 15  CREATININE 0.80 0.75  CALCIUM   --  9.5   Liver Function Tests:  Recent Labs Lab 05/25/13 2027  AST 33  ALT 35  ALKPHOS 108  BILITOT <0.2*  PROT 7.6  ALBUMIN 3.5   No results found for this basename: LIPASE, AMYLASE,  in the last 168 hours No results found for this basename: AMMONIA,  in the last 168 hours CBC:  Recent Labs Lab 05/25/13 0706 05/25/13 2027  WBC  --  3.4*  NEUTROABS  --  3.2  HGB 12.6 11.7*  HCT 37.0 35.2*  MCV  --  88.4  PLT  --  264   Cardiac Enzymes: No results found for this basename: CKTOTAL, CKMB, CKMBINDEX, TROPONINI,  in the last 168 hours BNP:  Recent Labs Lab 05/27/13 0554  PROBNP 197.0*   D-Dimer: No results found for this basename: DDIMER,  in the last 168 hours CBG: No results found for this basename: GLUCAP,  in the last 168 hours Hemoglobin A1C: No results found for this basename: HGBA1C,  in the last 168 hours Fasting Lipid Panel: No results found for this basename: CHOL, HDL, LDLCALC, TRIG, CHOLHDL, LDLDIRECT,  in the last 168 hours Thyroid Function Tests: No results found for this basename: TSH, T4TOTAL, FREET4, T3FREE, THYROIDAB,  in the last 168 hours Coagulation: No results found for this basename: LABPROT, INR,  in the  last 168 hours Anemia Panel: No results found for this basename: VITAMINB12, FOLATE, FERRITIN, TIBC, IRON, RETICCTPCT,  in the last 168 hours Urine Drug Screen: Drugs of Abuse  No results found for this basename: labopia,  cocainscrnur,  labbenz,  amphetmu,  thcu,  labbarb    Alcohol Level: No results found for this basename: ETH,  in the last 168 hours Urinalysis:  Recent Labs Lab 05/25/13 0830  COLORURINE YELLOW  LABSPEC 1.016  PHURINE 6.5  GLUCOSEU NEGATIVE  HGBUR NEGATIVE  BILIRUBINUR NEGATIVE  KETONESUR NEGATIVE  PROTEINUR NEGATIVE  UROBILINOGEN 1.0  NITRITE NEGATIVE  LEUKOCYTESUR NEGATIVE    Micro Results: No results found for this or any previous visit (from the past 240 hour(s)). Studies/Results: X-ray Chest Pa  And Lateral   05/25/2013   CLINICAL DATA:  Cough.  EXAM: CHEST  2 VIEW  COMPARISON:  Chest x-ray 02/05/2012.  FINDINGS: Pulmonary venous congestion without frank pulmonary edema. No consolidative airspace disease. No pleural effusions. Heart size appears mildly enlarged. Upper mediastinal contours are within normal limits. Atherosclerosis in the thoracic aorta.  IMPRESSION: 1. Mild cardiomegaly with pulmonary venous congestion, but no frank pulmonary edema. 2. Atherosclerosis.   Electronically Signed   By: Trudie Reedaniel  Entrikin M.D.   On: 05/25/2013 19:23   Medications: I have reviewed the patient's current medications. Scheduled Meds: . cholecalciferol  1,000 Units Oral Daily  . Fingolimod HCl  1 capsule Oral Daily  . heparin  5,000 Units Subcutaneous Q8H  . methylPREDNISolone (SOLU-MEDROL) injection  1,000 mg Intravenous Daily  . pantoprazole  40 mg Oral Daily  . vitamin C  500 mg Oral Daily   Continuous Infusions:  PRN Meds:.guaifenesin, ibuprofen, oxyCODONE-acetaminophen  Assessment: 53 year old woman with past medical history of hypertension and MS who presented on 1/26 with acute flare of MS.   Plan:  Multiple Sclerosis Flare - improving, however still not at baseline (requiring walker to ambulate). Pt presented with LE sensation loss and paresis with resulting fall.  -Frequent neuro checks -Continue IV 1 g Solumedrol Day 3/3, per neurology start PO prednisone starting 1/29 with 30 mg x 3 days, 20 mg x 2 days, 5 mg x 2 days, 5 mg x 1 day, and then STOP -Appreciate  PT consult recommendations ---> outpatient PT  -Obtain OT consult --> tub/shower bench -Continue Gilenya 0.5 mg daily  -Pt scheduled to follow-up with Dr. Terrace ArabiaYan   Hypertension - currently hypertensive. Pt not on home anti-hypertensives. Possibly due to high dose corticosteroid therapy.  -Consider outpatient anti-hypertensive therapy   Normocytic Anemia - Pt with Hg of 11.7 on admission with no active bleeding or hemodynamic  instability. Etiology unknown.  -Consider outpatient follow-up with anemia panel   Leukopenia -Pt with WBC of 3.4 most likely due to immunosuppression in setting of Gilenya.  -Monitor for infection   Mild cardiomegaly with pulmonary venous congestion - Seen on chest xray on 1/26. Pt not fluid overloaded.  -Consider pro-BNP---> 197 mildy elevated, most likely not CHF  Dry Cough - improving. Etiology most likely due to viral URI. Chest xray without infectious etiology.  -Robitussin cough syrup as needed  Hypokalemia - resolved.  Pt with potassium of 3.5 on admission and received 20 mEq KCl on 1/26   Diet: Regular DVT Ppx: SQ Heparin TID Code: Full   Dispo: Disposition is deferred at this time, awaiting improvement of current medical problems.  Anticipated discharge in approximately 1 day(s).   The patient does have a current PCP Levert Feinstein(Yijun Yan, MD) and does  need an [facility] hospital follow-up appointment after discharge.  The patient does not have transportation limitations that hinder transportation to clinic appointments.  .Services Needed at time of discharge: Y = Yes, Blank = No PT:   OT:   RN:   Equipment:   Other:     LOS: 2 days   Otis Brace, MD 05/27/2013, 3:59 PM

## 2013-05-27 NOTE — Discharge Instructions (Signed)
-  Please take prednisone taper tomorrow on 05/28/13: 30 mg x 3 days, 20 mg x 2 days, 5 mg x 2 days, 5 mg x 1 day, and then STOP prednisone -Please attend outpatient physical therapy  -If needed take prilosec for acid reflux -Please attend your follow-up appointment

## 2013-05-27 NOTE — Evaluation (Signed)
Occupational Therapy Evaluation Patient Details Name: Alexis Cervantes MRN: [id number] DOB: [birth year] Today's Date: 05/27/2013 Time: 1165-7903 OT Time Calculation (min): 45 min  OT Assessment / Plan / Recommendation History of present illness pt presents with MS exacerbation.     Clinical Impression   Patient evaluated by Occupational Therapy with no further acute OT needs identified. All education has been completed and the patient has no further questions. Pt currently is supervision for ADLs.  Discussed options for tub DME.  Pt would like a tub transfer bench.  Agree with PT recommendations for OPPT.  See below for any follow-up Occupational Therapy or equipment needs. OT is signing off. Thank you for this referral.     OT Assessment  Patient does not need any further OT services    Follow Up Recommendations  No OT follow up;Supervision - Intermittent    Barriers to Discharge      Equipment Recommendations  Tub/shower bench    Recommendations for Other Services    Frequency       Precautions / Restrictions Precautions Precautions: Fall Restrictions Weight Bearing Restrictions: No   Pertinent Vitals/Pain     ADL  Eating/Feeding: Independent Where Assessed - Eating/Feeding: Chair Grooming: Wash/dry hands;Wash/dry face;Teeth care;Supervision/safety Where Assessed - Grooming: Unsupported standing Upper Body Bathing: Set up Where Assessed - Upper Body Bathing: Unsupported sitting Lower Body Bathing: Supervision/safety Where Assessed - Lower Body Bathing: Unsupported sit to stand Upper Body Dressing: Set up Where Assessed - Upper Body Dressing: Unsupported sitting Lower Body Dressing: Supervision/safety Where Assessed - Lower Body Dressing: Unsupported sit to stand Toilet Transfer: Supervision/safety Toilet Transfer Method: Sit to Barista: Raised toilet seat with arms (or 3-in-1 over toilet) Toileting - Clothing Manipulation and Hygiene:  Supervision/safety Where Assessed - Engineer, mining and Hygiene: Sit to stand from 3-in-1 or toilet Tub/Shower Transfer: Supervision/safety Tub/Shower Transfer Method: Science writer: Counsellor Used: Rolling walker Transfers/Ambulation Related to ADLs: supervision ADL Comments: Discussed options for tub DME and practiced actual transfer.  Also discussed options for grab bars     OT Diagnosis:    OT Problem List:   OT Treatment Interventions:     OT Goals(Current goals can be found in the care plan section) Acute Rehab OT Goals Patient Stated Goal: home  Visit Information  Last OT Received On: 05/27/13 Assistance Needed: +1 History of Present Illness: pt presents with MS exacerbation.         Prior Functioning     Home Living Family/patient expects to be discharged to:: Private residence Living Arrangements: Non-relatives/Friends (pt states she has a "spiritual son".  ) Available Help at Discharge: Friend(s);Available PRN/intermittently Type of Home: House Home Access: Ramped entrance Home Layout: One level Home Equipment: Walker - 2 wheels;Cane - quad Prior Function Level of Independence: Independent with assistive device(s) Communication Communication: No difficulties         Vision/Perception Vision - History Baseline Vision: Wears glasses all the time Patient Visual Report: No change from baseline (Pt reports diplopia has resolved and no further visual issue) Vision - Assessment Vision Assessment: Vision not tested   Cognition  Cognition Arousal/Alertness: Awake/alert Behavior During Therapy: WFL for tasks assessed/performed Overall Cognitive Status: Within Functional Limits for tasks assessed    Extremity/Trunk Assessment Upper Extremity Assessment Upper Extremity Assessment: Overall WFL for tasks assessed Lower Extremity Assessment Lower Extremity Assessment: Defer to PT evaluation Cervical  / Trunk Assessment Cervical / Trunk Assessment: Normal     Mobility  Transfers Overall transfer level: Needs assistance Equipment used: Rolling walker (2 wheeled) Transfers: Sit to/from UGI CorporationStand;Stand Pivot Transfers Sit to Stand: Supervision Stand pivot transfers: Supervision General transfer comment: pt demos good technique, but increased effort needed to come to stand.  Complete 5 stands without UE support     Exercise     Balance Balance Overall balance assessment: Needs assistance Standing balance support: Single extremity supported Standing balance-Leahy Scale: Good   End of Session OT - End of Session Equipment Utilized During Treatment: Rolling walker Activity Tolerance: Patient tolerated treatment well Patient left: in chair;with call bell/phone within reach  GO     Alexis Cervantes M 05/27/2013, 4:17 PM

## 2013-05-27 NOTE — Progress Notes (Signed)
Physical Therapy Treatment Patient Details Name: Alexis Cervantes MRN: [id number] DOB: 01-23-1961 Today's Date: 05/27/2013 Time: 3383-2919 PT Time Calculation (min): 24 min  PT Assessment / Plan / Recommendation  History of Present Illness pt presents with MS exacerbation.     PT Comments   Patient progressing well with mobility today. States that legs are feeling much stronger. At this time patient is too high functioning for CIR however patient would benefit from OUTPATIENT PHYSICAL THERAPY to increase functional mobility and balance. Patient does have assistance when needed at home.   Follow Up Recommendations  Outpatient PT;Supervision - Intermittent     Does the patient have the potential to tolerate intense rehabilitation     Barriers to Discharge        Equipment Recommendations  None recommended by PT    Recommendations for Other Services    Frequency     Progress towards PT Goals Progress towards PT goals: Progressing toward goals  Plan Discharge plan needs to be updated    Precautions / Restrictions Precautions Precautions: Fall   Pertinent Vitals/Pain no apparent distress     Mobility  Transfers Sit to Stand: Supervision General transfer comment: pt demos good technique, but increased effort needed to come to stand.  Complete 5 stands without UE support Ambulation/Gait Ambulation/Gait assistance: Min guard Ambulation Distance (Feet): 200 Feet Assistive device: Rolling walker (2 wheeled) Gait Pattern/deviations: Step-through pattern;Decreased stride length;Wide base of support General Gait Details: Continues with wide BOS but no tremors noted. Cues to stay closer within RW    Exercises     PT Diagnosis:    PT Problem List:   PT Treatment Interventions:     PT Goals (current goals can now be found in the care plan section)    Visit Information  Last PT Received On: 05/27/13 Assistance Needed: +1 History of Present Illness: pt presents with MS  exacerbation.      Subjective Data      Cognition  Cognition Arousal/Alertness: Awake/alert Behavior During Therapy: WFL for tasks assessed/performed Overall Cognitive Status: Within Functional Limits for tasks assessed    Balance  Balance Standing balance support: Single extremity supported Standing balance-Leahy Scale: Good  End of Session PT - End of Session Equipment Utilized During Treatment: Gait belt Activity Tolerance: Patient tolerated treatment well Patient left: in chair;with call bell/phone within reach Nurse Communication: Mobility status   GP     Fredrich Birks 05/27/2013, 2:37 PM 05/27/2013 Fredrich Birks PTA [phone] pager [phone] office

## 2013-05-27 NOTE — Progress Notes (Signed)
Reviewed note and discussed with PTA and MD.  Agree with recommendation for outpatient PT as patient was not accepted into CIR.  [address] [place], [place]  [phone] 05/27/2013

## 2013-06-01 NOTE — Discharge Summary (Signed)
Name: Alexis Cervantes MRN: [id number] DOB: Feb 27, 1961 53 y.o. PCP: Levert Feinstein, MD  Date of Admission: 05/25/2013  5:47 AM Date of Discharge: 05/28/2013 Attending Physician: Farley Ly, MD  Discharge Diagnosis:  Primary: Relapsing-Remitting Multiple Sclerosis Exacerbation    Active Problems: Hypertension  Normocytic Anemia  Leukopenia  Mild cardiomegaly with pulmonary venous congestion  Dry Cough.    Hypokalemia    DVT Prophylaxis    Discharge Medications:   Medication List         cholecalciferol 1000 UNITS tablet  Commonly known as:  VITAMIN D  Take 1,000 Units by mouth daily.     GILENYA 0.5 MG Caps  Generic drug:  Fingolimod HCl  Take 1 capsule by mouth daily.     ibuprofen 200 MG tablet  Commonly known as:  ADVIL,MOTRIN  Take 600 mg by mouth every 6 (six) hours as needed for headache, mild pain or moderate pain.     predniSONE 5 MG tablet  Commonly known as:  DELTASONE  Take 30 mg (6 tablets)  for 3 days, then 20 mg (4 tablets) for 2 days, then 5 mg (1 tablet) x 2 days  then 5 mg (1 tablet) x 1 day then STOP     vitamin C 500 MG tablet  Commonly known as:  ASCORBIC ACID  Take 500 mg by mouth daily.        Disposition and follow-up:   Ms.Aydan Thurman was discharged from [facility] in Good condition.  At the hospital follow up visit please address:  1.  Resolution of MS flare and compliance with steroid taper and outpatient PT       Blood pressure       Etiology of normocytic anemia            Resolution of cough        Monitor for LE edema in setting of pulmonary vascular congestion and mild cardiomegaly  2.  Labs / imaging needed at time of follow-up: CBC (H/H),  anemia panel   3.  Pending labs/ test needing follow-up: none  Follow-up Appointments:     Follow-up Information   Follow up with Levert Feinstein, MD On 06/10/2013. (10:45am)    Specialty:  Neurology   Contact information:   [address] [place]  [address] [phone]       Discharge Instructions: Discharge Orders   Future Appointments Provider Department Dept Phone   06/03/2013 8:45 AM Clarita Crane, PT [facility] [phone]   06/10/2013 11:00 AM Levert Feinstein, MD [facility] [phone]   Future Orders Complete By Expires   Ambulatory referral to Physical Therapy  As directed    Comments:     Outpatient physical therapy for MS exacerbation   Questions:     Iontophoresis - 4 mg/ml of dexamethasone:     T.E.N.S. Unit Evaluation and Dispense as Indicated:     Increase activity slowly  As directed       Consultations: Neurology, Physical Medicine and Rehabilitation   Procedures Performed:  X-ray Chest Pa And Lateral   05/25/2013   CLINICAL DATA:  Cough.  EXAM: CHEST  2 VIEW  COMPARISON:  Chest x-ray 02/05/2012.  FINDINGS: Pulmonary venous congestion without frank pulmonary edema. No consolidative airspace disease. No pleural effusions. Heart size appears mildly enlarged. Upper mediastinal contours are within normal limits. Atherosclerosis in the thoracic aorta.  IMPRESSION: 1. Mild cardiomegaly with pulmonary venous congestion, but no frank pulmonary edema.  2. Atherosclerosis.   Electronically Signed   By: Trudie Reedaniel  Entrikin M.D.   On: 05/25/2013 19:23    2D Echo: none  Cardiac Cath: none  Admission HPI: Original Author Alexis HarpXilin Niu, MD  Patient is a 53 year old lady with a past medical history of multiple sclerosis (diagnosed on 2006), , who presents with weakness and decreased sensation in legs bilaterally.   The patient reports that she is currently taking Gilenya for her multiple sclerosis at home . She did not miss any doses. She has been in her baseline health condition until this morning. Patient reports that she woke up at about 3:00 AM and was about to to the bathroom, but fell on the ground without any injury, then she realized that she could not feel her  legs. Her legs were very weak bilaterally. Patient did not lose consciousness, no urinary incontinence, did not lose control of bladder. The patient states she normally has numbness and weakness in her feet, but not as severe as today. She has intermittent blurry vision, which has not changed significantly.. When I evaluated the patient, patient feels fine, but could not stand up to have a walk in ED.   The patient reports having mild dry cough in the past 3 days, but no fever, chills, shortness of breath or chest pain.  Hospital Course by problem list:  Relapsing-Remitting Multiple Sclerosis Exacerbation - Pt with history of RR-MS diagnosed in 2006 who presented with bilateral lower extremity sensation loss and paresis (L>R) with resulting fall at home. Pt was seen by neurology who did not recommend MRI brain imaging. Pt received frequent neuro checks during hospitalization with no worsening of neurological status. Pt received IV 1 g solumedrol for three days per neurology with improvement of lower extremity paresthesia and paresis however still was not at baseline and required walker to ambulate. Pt also received pantoprazole for GI protection in setting of corticosteroid therapy during hospitalization. Pt received PT and OT therapy during hospitalization. Physical medicine and rehabilitation did not recommend inpatient rehabilitation and PT recommended outpatient rehabilitation.  Per neurology recommendations, patient to start PO prednisone starting 1/29 with 30 mg x 3 days, 20 mg x 2 days, 5 mg x 2 days, 5 mg x 1 day, and then STOP prednisone. Pt received home Gilenya 0.5 mg daily during hospitalization and to continue on discharge. Pt scheduled for outpatient physical therapy and to follow-up with her neurologist Dr. Terrace ArabiaYan.      Hypertension - Pt with blood pressure range of 138/70 to 168/88 during hospitalization. Pt did not receive anti-hypertensive during hospitalization and was not on any home  anti-hypertensives. Etiology possibly due to high dose corticosteroid therapy versus essential hypertension. Pt to be monitored as outpatient and if anti-hypertensive therapy warranted.   Normocytic Anemia - Pt with Hg of 11.7 on admission with no active bleeding or hemodynamic instability. Etiology unknown. Pt to have outpatient follow-up and anemia panel.   Leukopenia -Pt with WBC of 3.4 most likely due to immunosuppression in setting of Gilenya. Pt was monitored for infection during hospitalization.   Mild cardiomegaly with pulmonary venous congestion - Image findings seen on chest xray on 1/26. Pro-BNP was  mildy elevated (197). 12-lead EKG did not reveal ischemic changes.  Pt was not volume overloaded during hospitalization and did not require diuretic therapy.      Dry Cough - Pt with dry cough on admission most likely due to viral URI. Chest xray did not reveal infectious etiology. Pt received robitussin cough  syrup as needed during hospitalization with improvement in symptoms.     Hypokalemia - Pt presented with potassium of 3.5 on admission that normalized with oral potassium chloride supplementation.    DVT Prophylaxis - Pt received SQ Heparin during hospitalization with no DVT or HIT syndrome.       Discharge Vitals:   BP 150/84  Pulse 75  Temp(Src) 98.2 F (36.8 C) (Oral)  Resp 18  Ht 5\' 6"  (1.676 m)  Wt 246 lb 14.6 oz (112 kg)  BMI 39.87 kg/m2  SpO2 99%  Discharge Labs:  No results found for this or any previous visit (from the past 24 hour(s)).  Signed: Otis Brace, MD 06/01/2013, 12:47 AM   Time Spent on Discharge: 50 minutes Services Ordered on Discharge: outpatient PT Equipment Ordered on Discharge: tub/shower bench

## 2013-06-03 ENCOUNTER — Ambulatory Visit: Payer: 59 | Attending: Internal Medicine | Admitting: Physical Therapy

## 2013-06-03 DIAGNOSIS — M6281 Muscle weakness (generalized): Secondary | ICD-10-CM | POA: Insufficient documentation

## 2013-06-03 DIAGNOSIS — R269 Unspecified abnormalities of gait and mobility: Secondary | ICD-10-CM | POA: Insufficient documentation

## 2013-06-03 DIAGNOSIS — IMO0001 Reserved for inherently not codable concepts without codable children: Secondary | ICD-10-CM | POA: Insufficient documentation

## 2013-06-03 DIAGNOSIS — G35 Multiple sclerosis: Secondary | ICD-10-CM | POA: Insufficient documentation

## 2013-06-09 ENCOUNTER — Ambulatory Visit: Payer: 59 | Admitting: Physical Therapy

## 2013-06-10 ENCOUNTER — Encounter: Payer: Self-pay | Admitting: Neurology

## 2013-06-10 ENCOUNTER — Ambulatory Visit (INDEPENDENT_AMBULATORY_CARE_PROVIDER_SITE_OTHER): Payer: 59 | Admitting: Neurology

## 2013-06-10 VITALS — BP 145/86 | HR 74 | Wt 238.0 lb

## 2013-06-10 DIAGNOSIS — G35 Multiple sclerosis: Secondary | ICD-10-CM

## 2013-06-10 NOTE — Progress Notes (Signed)
Alexis Cervantes is a 53 year old right-handed black female with a history of relapsing remitting multiple sclerosis, following up of her most recent hospital admission  in May 25 2013.  She was diagnosed with multiple sclerosis since Feb 2006.  She presented with right jaw pain, memory loss, fatigue, blurry vision, but no gait difficulty when she was first diagnosed in 2006 .  She began to have gradually worsening gait problems and difficulty with balance since 2012. She has numbness of the hands and feet,  have some blurred vision, and this improves when she closes her right eye.  She denies problems controlling the bowels or the bladder.   She was in enrolled in [place]REF[place] trial, was randomized to Gilenya since November 2013. From 2013 till the end of 2014, she is very stable clinical wise, continue have baseline mild gait difficulty, ambulate with cane as needed.     Abnormal MRI brain: multiple supratentorial (periventricular and subcortical) and infratentorial (cerebellar and pontine) chronic demyelinating plaques. Some of these are hypointense on T1 views. No abnormal lesions are seen on post contrast views.   She had repeat MRI of the brain November 2013, later also in March 2014, July 2014,  hile taking Gilenya, there was no significant change clinical wise or  Image wise.  MRI cervical most recent in 2006 showed  multiple chronic demyelinating plaques at C2, C2-3, C3, C5, C7.  No acute plaques are seen. At C4-5, C5-6, C6-7: Spondylosis and disc bulging with severe biforaminal stenosis.  At C3-4: Disc bulging there is moderate biforaminal stenosis.  She complains of left hip for a while, now no longer hurting her.  UPDATE Oct 16th 2014  In Jan 26th 2015, she woke up in the middle of the night using bathroom,   noticed dense numbness at bilateral lower extremities, her legs give out underneath her, she was not able to ambulate.  She was  admitted to the hospital,  treated with iv steroid  x 3 days, followed by po tapering, now walking with walker, she is also receiving physical therapy there is mild improvement of her walking, but she complains of bilateral lower extremity paresthesia, weakness, right more than left, no bowel bladder incontinence,       Physical Exam  General:  The patient is moderately obese. Head: normal cephalic Ears, Nose and Throat: normal Neck: Neck is supple, no carotid bruits noted. Respiratory: Respiratory examination is clear. Cardiovascular: Cardiovascular examination reveals a regular rate and rhythm Musculoskeletal: no deformity Skin: Extremities are without significant edema. Trunk: normal  Neurologic Exam  Mental Status: awake, alert, cooperative on examination Cranial Nerves: Facial symmetry is present. fundi sharp bilaterally. extrocular movement was full, visual field is full on confrontational test, facial sensation and strength were normal. Hearing intact bilaterally, head turning and shoulder shrug are normal bilaterally.  Speech is well enunciated, no aphasia or dysarthria is noted.  Tongue protrusion into cheek strength were normal. Motor:  moderate bilateral lower extremity spasticity, mild bilateral hip flexion weakness (R/L), hip flexion 4/5, knee flexion4/5 knee extension 4/5 ankle dorsi flexion 4/4+,  upper extremity motor strength is normal with mild spasticity.. Sensory: decreased pinprick, soft touch, vibratory sensation at right hand, lower extremities, there is a stocking pattern pinprick sensory deficit up to the knees bilaterally, decreased vibratory sensation to midshin, impaired toe proprioception. Coordination:  finger-to-nose and heel-to-shin were normal bilaterally. Gait and Station: wide based, stiff, unsteady gait, cautious, spastice gait, dragging her right leg, relies on her walker Reflexes: hyperreflexia  of bilateral upper and lower extremity muscles, nonsustained left ankle clonus, plantar responses were flexor at  right, extensor at left side.  Assessment and plan: 53 years old [place] American female, with history of relapsing remitting multiple sclerosis, was tolerating Gilenya well, but had recent flareup in May 25 2013, presenting with bilateral lower extremity weakness, numbness, increased gait difficulty previous extensive brain, and also cervical lesions,     1. current flareup most likely represent a cervical event, 2 proceed with MRI of the brain, cervical spine with without contrast 3. I will consider switch her over to to Tysarbri infusion, JC virus antibody 4 physical therapy 5 return to clinic in one to 2 months.  Marland Kitchen  ,

## 2013-06-11 ENCOUNTER — Ambulatory Visit: Payer: 59 | Admitting: Physical Therapy

## 2013-06-11 ENCOUNTER — Other Ambulatory Visit (INDEPENDENT_AMBULATORY_CARE_PROVIDER_SITE_OTHER): Payer: 59

## 2013-06-11 DIAGNOSIS — Z0289 Encounter for other administrative examinations: Secondary | ICD-10-CM

## 2013-06-12 LAB — THYROID PANEL WITH TSH
FREE THYROXINE INDEX: 1.7 (ref 1.2–4.9)
T3 Uptake Ratio: 26 % (ref 24–39)
T4, Total: 6.5 ug/dL (ref 4.5–12.0)
TSH: 0.385 u[IU]/mL — ABNORMAL LOW (ref 0.450–4.500)

## 2013-06-12 NOTE — Progress Notes (Signed)
Quick Note:  Alexis Cervantes: Laboratory showed elevated TSH, indicating hypothyroid function, I have faxed the laboratory result to her primary care physician Dr. Audria NineMcPherson, she should continue followup with her primary care about thyroid functional test and the potential treatment ______

## 2013-06-15 ENCOUNTER — Telehealth: Payer: Self-pay

## 2013-06-15 NOTE — Telephone Encounter (Signed)
Pt left voicemail for referrals. States she has [facility] [employer] [facility] and has Dr Audria NineMcPherson listed as her PCP. States she needs an MRI and needs a referral for it.  Epic shows Ms Luiz BlareGraves saw Dr Clelia CroftShaw once in 2013 and has not seen anyone else in epic. Pt last seen in [facility] 02/13/2010.  Please advise. I think this might be a compass plan that acts like [employer] in its need for a referral through insurance, however the pt has never seen dr Audria Ninemcpherson.   Thanks bf

## 2013-06-16 ENCOUNTER — Ambulatory Visit: Payer: 59 | Admitting: Physical Therapy

## 2013-06-16 NOTE — Telephone Encounter (Signed)
I agree the patient must be seen to determine if an MRI is necessary.

## 2013-06-16 NOTE — Telephone Encounter (Signed)
Pt will have to be seen in order to get the MRI referral (especially since she has not been here since [id number]. Thanks.

## 2013-06-16 NOTE — Telephone Encounter (Signed)
Pt called to check status of referral request. i didn't remember she called previously. i began to enter message and saw communication below. Called pt back and left detailed vmail with instructions to see a provider for a referral . Bf

## 2013-06-16 NOTE — Telephone Encounter (Signed)
Lm for rtn call 

## 2013-06-17 NOTE — Telephone Encounter (Signed)
Spoke to patient, she understands she will nee to be seen in our office for an exam since she has not been here for such a long period of time. She understands we need supporting documentation to order her MRI for insurance purposes.

## 2013-06-18 ENCOUNTER — Ambulatory Visit: Payer: 59 | Admitting: Physical Therapy

## 2013-06-18 NOTE — Progress Notes (Signed)
Quick Note:  Spoke to patient and relayed elevated TSH, and that she needs to follow up with her PCP about treatment, per Dr. Terrace Arabia. Patient is trying to get into PCP before her MRI on 06-25-13, apparently it is required by her insurance. ______

## 2013-06-20 ENCOUNTER — Ambulatory Visit (INDEPENDENT_AMBULATORY_CARE_PROVIDER_SITE_OTHER): Payer: 59 | Admitting: Family Medicine

## 2013-06-20 ENCOUNTER — Encounter: Payer: Self-pay | Admitting: Family Medicine

## 2013-06-20 VITALS — BP 132/90 | HR 71 | Temp 97.9°F | Resp 18 | Ht 66.0 in | Wt 243.0 lb

## 2013-06-20 DIAGNOSIS — R946 Abnormal results of thyroid function studies: Secondary | ICD-10-CM

## 2013-06-20 DIAGNOSIS — R7989 Other specified abnormal findings of blood chemistry: Secondary | ICD-10-CM

## 2013-06-20 DIAGNOSIS — M5090 Cervical disc disorder, unspecified, unspecified cervical region: Secondary | ICD-10-CM

## 2013-06-20 DIAGNOSIS — G35 Multiple sclerosis: Secondary | ICD-10-CM

## 2013-06-20 LAB — TSH: TSH: 0.456 u[IU]/mL (ref 0.350–4.500)

## 2013-06-20 NOTE — Progress Notes (Signed)
Subjective: 53 year old lady who comes here today because she needs a formal referral to the neurologist she is saying. Apparently insurance is changed recently on her and the new, he requires her to have a referral to the specialist to be cared for and to have tests such as her MRI ordered through the neurologist. She has been seeing the same practice for many years. She has a history of MS since 2006, and is currently being cared for by Dr. Terrace Arabia who is the MS research specialist for the group. They switched her to a new medication during the last year. Recently she had a flareup, and has lost more function in her lower extremities.  The patient also had lab work done that showed a low TSH despite the normal thyroid functions. This needs to be followed up on to make sure she is not hyperthyroid.  No major acute complaints today. She needed to come to this practice because we have served as her primary care physician's.  Objective: Neck supple without nodes. No thyromegaly.  Assessment: Low TSH without clinical hyperthyroidism, with normal thyroid hormone levels MS Cervical disc disease  Plan: Fluoroscopy to the neurologist to make the referral official. Recheck TSH.  We'll probably need follow up again in about 6 months.

## 2013-06-20 NOTE — Patient Instructions (Addendum)
We will let you know the results of your thyroid tests in a few days. At this point no treatment is needed.  We will make formal referral to the neurologist, Dr.Yan, for ongoing management of the MS  Return at any time if problems should arise

## 2013-06-23 ENCOUNTER — Ambulatory Visit: Payer: 59 | Admitting: Physical Therapy

## 2013-06-25 ENCOUNTER — Other Ambulatory Visit: Payer: 59

## 2013-06-25 ENCOUNTER — Ambulatory Visit: Payer: 59 | Admitting: Physical Therapy

## 2013-06-30 ENCOUNTER — Ambulatory Visit: Payer: 59 | Attending: Internal Medicine | Admitting: Physical Therapy

## 2013-06-30 DIAGNOSIS — IMO0001 Reserved for inherently not codable concepts without codable children: Secondary | ICD-10-CM | POA: Insufficient documentation

## 2013-06-30 DIAGNOSIS — M6281 Muscle weakness (generalized): Secondary | ICD-10-CM | POA: Insufficient documentation

## 2013-06-30 DIAGNOSIS — R269 Unspecified abnormalities of gait and mobility: Secondary | ICD-10-CM | POA: Insufficient documentation

## 2013-06-30 DIAGNOSIS — G35 Multiple sclerosis: Secondary | ICD-10-CM | POA: Insufficient documentation

## 2013-07-02 ENCOUNTER — Ambulatory Visit: Payer: 59 | Admitting: Physical Therapy

## 2013-07-07 ENCOUNTER — Ambulatory Visit: Payer: 59 | Admitting: Physical Therapy

## 2013-07-08 ENCOUNTER — Ambulatory Visit: Payer: 59 | Admitting: Neurology

## 2013-07-09 ENCOUNTER — Other Ambulatory Visit: Payer: 59

## 2013-07-09 ENCOUNTER — Ambulatory Visit: Payer: 59 | Admitting: Physical Therapy

## 2013-07-14 ENCOUNTER — Ambulatory Visit: Payer: 59 | Admitting: Physical Therapy

## 2013-07-16 ENCOUNTER — Ambulatory Visit: Payer: 59 | Admitting: Physical Therapy

## 2013-07-21 ENCOUNTER — Ambulatory Visit: Payer: 59 | Admitting: Physical Therapy

## 2013-07-23 ENCOUNTER — Ambulatory Visit: Payer: 59 | Admitting: Physical Therapy

## 2013-07-28 ENCOUNTER — Ambulatory Visit: Payer: 59 | Admitting: Physical Therapy

## 2013-07-30 ENCOUNTER — Ambulatory Visit: Payer: 59 | Attending: Internal Medicine | Admitting: Physical Therapy

## 2013-07-30 DIAGNOSIS — R269 Unspecified abnormalities of gait and mobility: Secondary | ICD-10-CM | POA: Insufficient documentation

## 2013-07-30 DIAGNOSIS — G35 Multiple sclerosis: Secondary | ICD-10-CM | POA: Insufficient documentation

## 2013-07-30 DIAGNOSIS — M6281 Muscle weakness (generalized): Secondary | ICD-10-CM | POA: Insufficient documentation

## 2013-07-30 DIAGNOSIS — IMO0001 Reserved for inherently not codable concepts without codable children: Secondary | ICD-10-CM | POA: Insufficient documentation

## 2013-08-04 ENCOUNTER — Ambulatory Visit: Payer: 59 | Admitting: Physical Therapy

## 2013-08-07 ENCOUNTER — Ambulatory Visit: Payer: 59 | Admitting: Physical Therapy

## 2013-09-02 ENCOUNTER — Other Ambulatory Visit: Payer: 59

## 2013-09-17 ENCOUNTER — Ambulatory Visit (INDEPENDENT_AMBULATORY_CARE_PROVIDER_SITE_OTHER): Payer: 59

## 2013-09-17 DIAGNOSIS — G35 Multiple sclerosis: Secondary | ICD-10-CM

## 2013-09-17 MED ORDER — GADOPENTETATE DIMEGLUMINE 469.01 MG/ML IV SOLN: 20.0000 mL | Freq: Once | INTRAVENOUS | Status: AC | PRN

## 2013-10-02 ENCOUNTER — Encounter: Payer: Self-pay | Admitting: Neurology

## 2013-10-02 ENCOUNTER — Encounter (INDEPENDENT_AMBULATORY_CARE_PROVIDER_SITE_OTHER): Payer: Self-pay

## 2013-10-02 ENCOUNTER — Ambulatory Visit (INDEPENDENT_AMBULATORY_CARE_PROVIDER_SITE_OTHER): Payer: 59 | Admitting: Neurology

## 2013-10-02 VITALS — BP 153/93 | HR 69 | Temp 97.1°F | Ht 66.0 in | Wt 233.5 lb

## 2013-10-02 DIAGNOSIS — G35 Multiple sclerosis: Secondary | ICD-10-CM

## 2013-10-02 DIAGNOSIS — I10 Essential (primary) hypertension: Secondary | ICD-10-CM

## 2013-10-02 MED ORDER — BACLOFEN 10 MG PO TABS: ORAL_TABLET | ORAL | Status: DC

## 2013-10-02 MED ORDER — GABAPENTIN 300 MG PO CAPS: 300.0000 mg | ORAL_CAPSULE | Freq: Three times a day (TID) | ORAL | Status: DC

## 2013-10-02 MED ORDER — FINGOLIMOD HCL 0.5 MG PO CAPS: 1.0000 | ORAL_CAPSULE | Freq: Every day | ORAL | Status: DC

## 2013-10-02 NOTE — Progress Notes (Signed)
Alexis Cervantes is a 53 year old right-handed black female with a history of relapsing remitting multiple sclerosis, following up of her most recent hospital admission  in May 25 2013.  She was diagnosed with multiple sclerosis since Feb 2006.  She presented with right jaw pain, memory loss, fatigue, blurry vision, but no gait difficulty, when she was first diagnosed in 2006. She was treated with Betaseron from 2006 till 2013, when she was treated with Gilenya.  She began to have gradually worsening gait problems and difficulty with balance since 2012. She has numbness of the hands and feet,  have some blurred vision, and this improves when she closes her right eye.  She denies problems controlling the bowels or the bladder.   She was in enrolled in [place] PREFER trial, was randomized to Gilenya since November 2013. From 2013 till the end of 2014, she is very stable clinical wise, continue have baseline mild gait difficulty, ambulate with cane as needed.      MRI brain: multiple supratentorial (periventricular and subcortical) and infratentorial (cerebellar and pontine) chronic demyelinating plaques. Some of these are hypointense on T1 views. No abnormal lesions are seen on post contrast views.   She had repeat MRI of the brain November 2013, later also in March 2014, July 2014,  hile taking Gilenya, there was no significant change clinical wise or  Image wise.  MRI cervical most recent in 2006 showed  multiple chronic demyelinating plaques at C2, C2-3, C3, C5, C7.  No acute plaques are seen. At C4-5, C5-6, C6-7: Spondylosis and disc bulging with severe biforaminal stenosis.  At C3-4: Disc bulging there is moderate biforaminal stenosis.  She complains of left hip for a while, now no longer hurting her.  UPDATE Feb 2015,   In Jan 26th 2015, she woke up in the middle of the night using bathroom,   noticed dense numbness at bilateral lower extremities, her legs give out underneath her, she was not  able to ambulate.  She was  admitted to the hospital,  treated with iv steroid x 3 days, followed by po tapering, now walking with walker, she is also receiving physical therapy there is mild improvement of her walking, but she complains of bilateral lower extremity paresthesia, weakness, right more than left, no bowel bladder incontinence,     UPDATE June 5th 2015: She has to use walker, has no significant pain, she has no bowel bladder incontinence. JC virus antibody was 0.1 4 in February 2015, was negative.  We have reviewed  MRI scan of the brain showing multiple white matter hyperintensity in scattered throughout the brain stem, cerebellum and subcortical and periventricular white matter typical for multiple sclerosis. No enhancing lesions are noted. The presence of multiple T1 black holes and mild atrophy of corpus callosum indicates chronic disease. Overall progression of white matter changes compared with previous MRI scan dated 06/26/2004.  MRI scan of cervical spine showing mild spondylitic changes but without significant compression. Spinal cord white matter ill-defined hyperintensities throughout represent remote age demyelinating plaques. No enhancing lesions are noted. Slight progression of spinal cord white matter changes compared with MRI scan dated 07/19/2004  Physical Exam  General:  The patient is moderately obese. Head: normal cephalic Ears, Nose and Throat: normal Neck: Neck is supple, no carotid bruits noted. Respiratory: Respiratory examination is clear. Cardiovascular: Cardiovascular examination reveals a regular rate and rhythm Musculoskeletal: no deformity Skin: Extremities are without significant edema. Trunk: normal  Neurologic Exam  Mental Status: awake, alert, cooperative on  examination Cranial Nerves: Facial symmetry is present. fundi sharp bilaterally. extrocular movement was full, visual field is full on confrontational test, facial sensation and strength were  normal. Hearing intact bilaterally, head turning and shoulder shrug are normal bilaterally.  Speech is well enunciated, no aphasia or dysarthria is noted.  Tongue protrusion into cheek strength were normal. Motor:  moderate bilateral lower extremity spasticity, mild bilateral hip flexion weakness (R/L), hip flexion 4/5, knee flexion4/5 knee extension 4/5 ankle dorsi flexion 4/4+,  upper extremity motor strength is normal with mild spasticity.. Sensory: decreased pinprick, soft touch, vibratory sensation at right hand, lower extremities, there is a stocking pattern pinprick sensory deficit up to the knees bilaterally, decreased vibratory sensation to midshin, impaired toe proprioception. Coordination:  finger-to-nose and heel-to-shin were normal bilaterally. Gait and Station: wide based, stiff, unsteady gait, cautious, spastice gait, relies on her walker Reflexes: hyperreflexia of bilateral upper and lower extremity muscles, nonsustained left ankle clonus, plantar responses were flexor at right, extensor at left side.  Assessment and plan: 53 years old [place] American female, with history of relapsing remitting multiple sclerosis, was tolerating Gilenya well, but had recent flareup in May 25 2013, presenting with bilateral lower extremity weakness, numbness, increased gait difficulty previous extensive brain, and also cervical lesions, most recent flareup most likely represent a cervical event, repeat MRI of the brain and cervical spine did show progression of the disease in comparison to 2006, her JC virus antibody was negative, with titer of 0.14, after extensive discussion with patient, we decided to switch her to Tysarbri infusion, paperwork initiated,  Once she is improved, she should stop Gilenya for couple months, prior to her first infusion   return to clinic in  3 months  I also initiate baclofen 10 mg 3 times a day, titrating to 20 mg 3 times a day for lower extremity spasticity

## 2013-10-19 ENCOUNTER — Telehealth: Payer: Self-pay | Admitting: Neurology

## 2013-10-19 DIAGNOSIS — G35 Multiple sclerosis: Secondary | ICD-10-CM

## 2013-10-19 NOTE — Telephone Encounter (Signed)
Spoke to Touch program and the patient has been approved for Tysabri, I will also check a prior authorization once I receive a summary of Benefits.    Dr. Terrace ArabiaYan patient would like to know when to stop Gilenya.    I spoke to patient explained where we are in the process, [facility], I just want to be sure she doesn't need a PA.

## 2013-10-19 NOTE — Telephone Encounter (Signed)
Patient is inquiring about discontinuing Gilenya in relation to starting Tysabri Infusions.   Last OV note says: Once she is approved, she should stop Gilenya for couple months, prior to her first infusion.   Sending a message to nurse to check the status of Tysabri.

## 2013-10-19 NOTE — Telephone Encounter (Signed)
Patient questioning when should she stop taking  Fingolimod HCl (GILENYA) 0.5 MG CAPS prior to staret up of  Tysabri Infusions.  Please call and advise.

## 2013-10-19 NOTE — Telephone Encounter (Signed)
Lupita Leash, Please let her stop Gilenya now and wait for 2 months, come back for CBC before set the date for Tysarbri. I have ordered CBC for August 23rd. Once the result comes back, we will call her for Silvestre Moment date.

## 2013-10-20 NOTE — Telephone Encounter (Signed)
Spoke to patient and relayed the doctor wants her to stop Gilenya now and come back on August 23 for a CBC.  Once those results are back I will contact [facility] and set up her first Tysabri infusion.  Patient expressed understanding.  Also I will check if she needs a PA once I receive a summary of benefits.

## 2013-12-21 ENCOUNTER — Other Ambulatory Visit (INDEPENDENT_AMBULATORY_CARE_PROVIDER_SITE_OTHER): Payer: 59

## 2013-12-21 DIAGNOSIS — Z0289 Encounter for other administrative examinations: Secondary | ICD-10-CM

## 2013-12-21 DIAGNOSIS — G35 Multiple sclerosis: Secondary | ICD-10-CM

## 2013-12-21 LAB — CBC WITH DIFFERENTIAL
BASOS: 0 %
Basophils Absolute: 0 10*3/uL (ref 0.0–0.2)
EOS ABS: 0.1 10*3/uL (ref 0.0–0.4)
Eos: 4 %
HCT: 33.2 % — ABNORMAL LOW (ref 34.0–46.6)
HEMOGLOBIN: 11.5 g/dL (ref 11.1–15.9)
LYMPHS: 40 %
Lymphocytes Absolute: 1.4 10*3/uL (ref 0.7–3.1)
MCH: 29.1 pg (ref 26.6–33.0)
MCHC: 34.6 g/dL (ref 31.5–35.7)
MCV: 84 fL (ref 79–97)
MONOCYTES: 7 %
Monocytes Absolute: 0.2 10*3/uL (ref 0.1–0.9)
Neutrophils Absolute: 1.7 10*3/uL (ref 1.4–7.0)
Neutrophils Relative %: 49 %
Platelets: 300 10*3/uL (ref 150–379)
RBC: 3.95 x10E6/uL (ref 3.77–5.28)
RDW: 13.2 % (ref 12.3–15.4)
WBC: 3.5 10*3/uL (ref 3.4–10.8)

## 2013-12-23 ENCOUNTER — Telehealth: Payer: Self-pay | Admitting: Neurology

## 2013-12-23 NOTE — Telephone Encounter (Signed)
Alexis Cervantes:   Please call patient lab is normal. She should continue follow up visit in Sept 8th 2015.

## 2013-12-24 NOTE — Telephone Encounter (Signed)
Sent patient a letter about labs and keeping her follow up appt. And asked her for a working number.

## 2014-01-05 ENCOUNTER — Ambulatory Visit (INDEPENDENT_AMBULATORY_CARE_PROVIDER_SITE_OTHER): Payer: 59 | Admitting: Neurology

## 2014-01-05 ENCOUNTER — Encounter: Payer: Self-pay | Admitting: Neurology

## 2014-01-05 ENCOUNTER — Telehealth: Payer: Self-pay | Admitting: Neurology

## 2014-01-05 ENCOUNTER — Other Ambulatory Visit: Payer: Self-pay | Admitting: Neurology

## 2014-01-05 DIAGNOSIS — R269 Unspecified abnormalities of gait and mobility: Secondary | ICD-10-CM | POA: Insufficient documentation

## 2014-01-05 DIAGNOSIS — G35 Multiple sclerosis: Secondary | ICD-10-CM

## 2014-01-05 NOTE — Progress Notes (Signed)
Alexis Cervantes is a 53 year old right-handed black female with a history of relapsing remitting multiple sclerosis, following up of her most recent hospital admission  in May 25 2013.  She was diagnosed with multiple sclerosis since Feb 2006.  She presented with right jaw pain, memory loss, fatigue, blurry vision, but no gait difficulty, when she was first diagnosed in 2006. She was treated with Betaseron from 2006 till 2013, when she was treated with Gilenya (from Nov 2013 till June 2015)  She began to have gradually worsening gait problems and difficulty with balance since 2012. She has numbness of the hands and feet,  have some blurred vision, and this improves when she closes her right eye.  She denies problems controlling the bowels or the bladder.   She was in enrolled in [place] trial, was randomized to Gilenya since November 2013. From 2013 till the end of 2014, she is very stable clinical wise, continue have baseline mild gait difficulty, ambulate with cane as needed.     MRI brain: multiple supratentorial (periventricular and subcortical) and infratentorial (cerebellar and pontine) chronic demyelinating plaques. Some of these are hypointense on T1 views. No abnormal lesions are seen on post contrast views.   She had repeat MRI of the brain November 2013, later also in March 2014, July 2014,  hile taking Gilenya, there was no significant change clinical wise or  Image wise.  MRI cervical most recent in 2006 showed  multiple chronic demyelinating plaques at C2, C2-3, C3, C5, C7.  No acute plaques are seen. At C4-5, C5-6, C6-7: Spondylosis and disc bulging with severe biforaminal stenosis.  At C3-4: Disc bulging there is moderate biforaminal stenosis.  She complains of left hip for a while, now no longer hurting her.  UPDATE Feb 2015,   In Jan 26th 2015, she woke up in the middle of the night using bathroom,   noticed dense numbness at bilateral lower extremities, her legs give out  underneath her, she was not able to ambulate.  She was  admitted to the hospital,  treated with iv steroid x 3 days, followed by po tapering, now walking with walker, she is also receiving physical therapy there is mild improvement of her walking, but she complains of bilateral lower extremity paresthesia, weakness, right more than left, no bowel bladder incontinence,     UPDATE June 5th 2015: She has to use walker, has no significant pain, she has no bowel bladder incontinence. JC virus antibody was 0.1 4 in February 2015, was negative.  We have reviewed  MRI scan of the brain showing multiple white matter hyperintensity in scattered throughout the brain stem, cerebellum and subcortical and periventricular white matter typical for multiple sclerosis. No enhancing lesions are noted. The presence of multiple T1 black holes and mild atrophy of corpus callosum indicates chronic disease. Overall progression of white matter changes compared with previous MRI scan dated 06/26/2004.  MRI scan of cervical spine showing mild spondylitic changes but without significant compression. Spinal cord white matter ill-defined hyperintensities throughout represent remote age demyelinating plaques. No enhancing lesions are noted. Slight progression of spinal cord white matter changes compared with MRI scan dated 07/19/2004  UPDATE Sep 8th 2015: She has been taking gabapentin as needed basis, last use was June 2015, there was no worsening of her functional status since Gilenya was stopped since June 20 third, 2015, she has been taking baclofen 10 mg 2 tablets 3 times day since June fifth 2015 which did help her bilateral lower  extremity spasticity.  Repeat laboratory evaluation in August showed normal CBC, WBC was 3.5.  Physical Exam  General:  The patient is moderately obese. Head: normal cephalic Ears, Nose and Throat: normal Neck: Neck is supple, no carotid bruits noted. Respiratory: Respiratory examination is  clear. Cardiovascular: Cardiovascular examination reveals a regular rate and rhythm Musculoskeletal: no deformity Skin: Extremities are without significant edema. Trunk: normal  Neurologic Exam  Mental Status: awake, alert, cooperative on examination Cranial Nerves: Facial symmetry is present. fundi sharp bilaterally. extrocular movement was full, visual field is full on confrontational test, facial sensation and strength were normal. Hearing intact bilaterally, head turning and shoulder shrug are normal bilaterally.  Speech is well enunciated, no aphasia or dysarthria is noted.  Tongue protrusion into cheek strength were normal. Motor:  moderate bilateral lower extremity spasticity, mild bilateral hip flexion weakness (R/L), hip flexion 4/5, knee flexion4/5 knee extension 4/5 ankle dorsi flexion 4/4+,  upper extremity motor strength is normal with mild spasticity.. Sensory: decreased pinprick, soft touch, vibratory sensation at right hand, lower extremities, there is a stocking pattern pinprick sensory deficit up to the knees bilaterally, decreased vibratory sensation to midshin, impaired toe proprioception. Coordination:  finger-to-nose and heel-to-shin were normal bilaterally. Gait and Station: wide based, stiff, unsteady gait, cautious, spastice gait, relies on her walker Reflexes: hyperreflexia of bilateral upper and lower extremity muscles, nonsustained left ankle clonus, plantar responses were flexor at right, extensor at left side.  Assessment and plan: 53 years old [place] American female, with history of relapsing remitting multiple sclerosis, was tolerating Gilenya well, but had recent flareup in May 25 2013, presenting with bilateral lower extremity weakness, numbness, increased gait difficulty, previous MRI showed extensive brain, and also cervical lesions, most recent flareup most likely represent a cervical event, repeat MRI of the brain and cervical spine did show progression of  the disease in comparison to 2006, her JC virus antibody was negative, with titer of 0.14, after extensive discussion with patient, we decided to switch her to Menan infusion, paperwork initiated.  She has stopped Gilenya in October 20 2013, she has no flare ups,   Will try to start Tysarbri infusion soon.  She also agrees with [employer] 9-21 trial

## 2014-01-05 NOTE — Telephone Encounter (Signed)
Order was placed for Tysabri infusion in September 20 first 2015

## 2014-01-05 NOTE — Telephone Encounter (Signed)
Spoke to patient and she is scheduled for her first infusion of Tysabri on 01-18-14 at [facility], at 0800.   Dr. Terrace Arabia please put Tysabri orders in Epic.

## 2014-01-05 NOTE — Telephone Encounter (Signed)
Please start her on Tysarbri infusion

## 2014-01-12 ENCOUNTER — Encounter (INDEPENDENT_AMBULATORY_CARE_PROVIDER_SITE_OTHER): Payer: 59 | Admitting: Neurology

## 2014-01-12 DIAGNOSIS — Z0289 Encounter for other administrative examinations: Secondary | ICD-10-CM

## 2014-01-18 ENCOUNTER — Encounter (HOSPITAL_COMMUNITY)
Admission: RE | Admit: 2014-01-18 | Discharge: 2014-01-18 | Disposition: A | Discharge status: Home / Self Care | Payer: 59 | Source: Ambulatory Visit | Attending: Neurology | Admitting: Neurology

## 2014-01-18 VITALS — BP 153/97 | HR 74 | Temp 98.5°F | Resp 16

## 2014-01-18 DIAGNOSIS — G35 Multiple sclerosis: Secondary | ICD-10-CM | POA: Diagnosis not present

## 2014-01-18 MED ORDER — SODIUM CHLORIDE 0.9 % IV SOLN
300.0000 mg | INTRAVENOUS | Status: DC
  Administered 2014-01-18: 300 mg via INTRAVENOUS
  Filled 2014-01-18: qty 15

## 2014-01-18 MED ORDER — EPINEPHRINE 0.3 MG/0.3ML IJ SOAJ
0.3000 mg | INTRAMUSCULAR | Status: DC
  Filled 2014-01-18: qty 0.6

## 2014-01-18 MED ORDER — SODIUM CHLORIDE 0.9 % IV SOLN
500.0000 mg | INTRAVENOUS | Status: DC
  Filled 2014-01-18: qty 4

## 2014-01-18 MED ORDER — LORATADINE 10 MG PO TABS
10.0000 mg | ORAL_TABLET | ORAL | Status: DC
  Administered 2014-01-18: 10 mg via ORAL
  Filled 2014-01-18 (×2): qty 1

## 2014-01-18 MED ORDER — ACETAMINOPHEN 500 MG PO TABS
1000.0000 mg | ORAL_TABLET | ORAL | Status: DC
  Administered 2014-01-18: 1000 mg via ORAL
  Filled 2014-01-18 (×2): qty 2

## 2014-01-18 MED ORDER — SODIUM CHLORIDE 0.9 % IV SOLN
INTRAVENOUS | Status: DC
  Administered 2014-01-18: 08:00:00 via INTRAVENOUS

## 2014-01-18 NOTE — Progress Notes (Signed)
Nursing. Tolerated initial tysabri infusion well. Reviewed medication and future appointments with Pt and daughter.

## 2014-01-18 NOTE — Discharge Instructions (Signed)

## 2014-01-19 ENCOUNTER — Encounter: Payer: Self-pay | Admitting: Neurology

## 2014-01-20 ENCOUNTER — Ambulatory Visit (INDEPENDENT_AMBULATORY_CARE_PROVIDER_SITE_OTHER): Payer: Self-pay | Admitting: Neurology

## 2014-01-20 VITALS — BP 150/96 | HR 70 | Temp 98.2°F | Resp 16

## 2014-01-20 DIAGNOSIS — G35 Multiple sclerosis: Secondary | ICD-10-CM

## 2014-01-20 DIAGNOSIS — Z0289 Encounter for other administrative examinations: Secondary | ICD-10-CM

## 2014-01-20 NOTE — Progress Notes (Signed)
Subjective:    Patient ID: Alexis Cervantes is a 52 y.o. female came in for research follow up visit, see separate documentation  HPI    Review of Systems  Objective:  Neurologic Exam  Physical Exam  Assessment:    Plan:

## 2014-01-20 NOTE — Progress Notes (Signed)
Subject was seen for 09_21 Visit 1 today by Dr. Terrace Arabia. All required procedures were done. Patient was scheduled for next visit.

## 2014-01-28 ENCOUNTER — Ambulatory Visit (INDEPENDENT_AMBULATORY_CARE_PROVIDER_SITE_OTHER): Payer: Self-pay | Admitting: Neurology

## 2014-01-28 DIAGNOSIS — G35 Multiple sclerosis: Secondary | ICD-10-CM

## 2014-01-28 NOTE — Progress Notes (Signed)
Patient return for  visit per research protocol, she is tolerating the medication well, mild elevated blood pressure, 140 over 90, continued to observe, I also suggestive her moderate exercise,

## 2014-02-05 ENCOUNTER — Telehealth: Payer: Self-pay

## 2014-02-05 NOTE — Telephone Encounter (Signed)
Late entry: I called patient yesterday to review her "Week 3" telephone contact visit for [facility] 09_21. I spoke with patient and answered her questions.

## 2014-02-12 ENCOUNTER — Ambulatory Visit (INDEPENDENT_AMBULATORY_CARE_PROVIDER_SITE_OTHER): Payer: Self-pay | Admitting: Neurology

## 2014-02-12 DIAGNOSIS — R269 Unspecified abnormalities of gait and mobility: Secondary | ICD-10-CM

## 2014-02-12 DIAGNOSIS — I10 Essential (primary) hypertension: Secondary | ICD-10-CM

## 2014-02-12 DIAGNOSIS — G35 Multiple sclerosis: Secondary | ICD-10-CM

## 2014-02-12 MED ORDER — LISINOPRIL 10 MG PO TABS: 10.0000 mg | ORAL_TABLET | Freq: Every day | ORAL | Status: DC

## 2014-02-12 NOTE — Progress Notes (Signed)
Patient seen for her Visit 3 in Research today, 9-21. She is at stable dose of Baclofen ER 30mg  qhs. We will increase her baclofen ER to 40mg  qhs   I scheduled patient to come back for her Visit 4 Oct. 28, 2015 at 10:00 but told her I would call and check on her over the next week.  If the 40mg  Baclofen she was given today is not enough, she will need to come in for an Unscheduled Visit before Visit 4 because after that, there can't be a dose change after visit 4. She can increase up to 50mg  only (since that is 20mg  above her starting dose).

## 2014-02-12 NOTE — Progress Notes (Signed)
Alexis Cervantes is a 53 year old right-handed black female with a history of relapsing remitting multiple sclerosis, following up of [employer] Trial.  She was diagnosed with multiple sclerosis since Feb 2006.  She presented with right jaw pain, memory loss, fatigue, blurry vision, but no gait difficulty, when she was first diagnosed in 2006. She was treated with Betaseron from 2006 till 2013, then she was treated with Gilenya (from Nov 2013 till June 2015)  She began to have gradually worsening gait problems and difficulty with balance since 2012. She has numbness of the hands and feet,  have some blurred vision, and this improves when she closes her right eye.  She denies problems controlling the bowels or the bladder.   She was in enrolled in [place] trial, was randomized to Gilenya since November 2013. From 2013 till the end of 2014, she is very stable clinical wise, continue have baseline mild gait difficulty, ambulate with cane as needed.     MRI brain: multiple supratentorial (periventricular and subcortical) and infratentorial (cerebellar and pontine) chronic demyelinating plaques. Some of these are hypointense on T1 views. No abnormal lesions are seen on post contrast views.   She had repeat MRI of the brain November 2013, later also in March 2014, July 2014,  hile taking Gilenya, there was no significant change clinical wise or  Image wise.  MRI cervical most recent in 2006 showed  multiple chronic demyelinating plaques at C2, C2-3, C3, C5, C7.  No acute plaques are seen. At C4-5, C5-6, C6-7: Spondylosis and disc bulging with severe biforaminal stenosis.  At C3-4: Disc bulging there is moderate biforaminal stenosis.  She complains of left hip for a while, now no longer hurting her.  UPDATE Feb 2015,   In Jan 26th 2015, she woke up in the middle of the night using bathroom,   noticed dense numbness at bilateral lower extremities, her legs give out underneath her, she was not able to  ambulate.  She was  admitted to the hospital,  treated with iv steroid x 3 days, followed by po tapering, now walking with walker, she is also receiving physical therapy there is mild improvement of her walking, but she complains of bilateral lower extremity paresthesia, weakness, right more than left, no bowel bladder incontinence,     UPDATE June 5th 2015: She has to use walker, has no significant pain, she has no bowel bladder incontinence. JC virus antibody was 0.1 4 in February 2015, was negative.  We have reviewed  MRI scan of the brain showing multiple white matter hyperintensity in scattered throughout the brain stem, cerebellum and subcortical and periventricular white matter typical for multiple sclerosis. No enhancing lesions are noted. The presence of multiple T1 black holes and mild atrophy of corpus callosum indicates chronic disease. Overall progression of white matter changes compared with previous MRI scan dated 06/26/2004.  MRI scan of cervical spine showing mild spondylitic changes but without significant compression. Spinal cord white matter ill-defined hyperintensities throughout represent remote age demyelinating plaques. No enhancing lesions are noted. Slight progression of spinal cord white matter changes compared with MRI scan dated 07/19/2004  She is now on Tysarbri infusion since Sept 21 2015  UPDATE Sep 8th 2015: She has been taking gabapentin as needed basis, last use was June 2015, there was no worsening of her functional status since Gilenya was stopped since June 20 third, 2015, she has been taking baclofen 10 mg 2 tablets 3 times day since June fifth 2015 which did help  her bilateral lower extremity spasticity.  Repeat laboratory evaluation in August showed normal CBC, WBC was 3.5.  UPDATE Oct 16th 2015: [facility], she is on stable dose of baclofen ER 30 mg every night, tolerating it well, less spasticity at evening time, but next morning,  especially during the day, she noticed increased spasticity, muscle spasm achy pain  Today she was noticed to have elevated blood pressure in the range of 170/100, add on lisinopril 10mg  qday    Physical Exam  General:  The patient is moderately obese. Head: normal cephalic Ears, Nose and Throat: normal Neck: Neck is supple, no carotid bruits noted. Respiratory: Respiratory examination is clear. Cardiovascular: Cardiovascular examination reveals a regular rate and rhythm Musculoskeletal: no deformity Skin: Extremities are without significant edema. Trunk: normal  Neurologic Exam  Mental Status: awake, alert, cooperative on examination Cranial Nerves: Facial symmetry is present. fundi sharp bilaterally. extrocular movement was full, visual field is full on confrontational test, facial sensation and strength were normal. Hearing intact bilaterally, head turning and shoulder shrug are normal bilaterally.  Speech is well enunciated, no aphasia or dysarthria is noted.  Tongue protrusion into cheek strength were normal. Motor:  moderate bilateral lower extremity spasticity, mild bilateral hip flexion weakness (R/L), hip flexion 4/5, knee flexion4/5 knee extension 4/5 ankle dorsi flexion 4/4+,  upper extremity motor strength is normal with mild spasticity.. Sensory: decreased pinprick, soft touch, vibratory sensation at right hand, lower extremities, there is a stocking pattern pinprick sensory deficit up to the knees bilaterally, decreased vibratory sensation to midshin, impaired toe proprioception. Coordination:  finger-to-nose and heel-to-shin were normal bilaterally. Gait and Station: wide based, stiff, unsteady gait, cautious, spastice gait, relies on her walker Reflexes: hyperreflexia of bilateral upper and lower extremity muscles, nonsustained left ankle clonus, plantar responses were flexor at right, extensor at left side.  Assessment and plan: 53 years old [place] female, with  history of relapsing remitting multiple sclerosis, was tolerating Gilenya well, but had recent flareup in May 25 2013, presenting with bilateral lower extremity weakness, numbness, increased gait difficulty, previous MRI showed extensive brain, and also cervical lesions, most recent flareup most likely represent a cervical event, repeat MRI of the brain and cervical spine did show progression of the disease in comparison to 2006, her JC virus antibody was negative, with titer of 0.14, after extensive discussion with patient,   Will try to start Tysarbri infusion since Sept 21st 2015.Marland Kitchen.  She also agrees with [employer] 9-21 trial, titrating dose of baclofen ER,   She was noted to have hypertension, 170 over 100, I will start her on lisinopril 10 mg every day

## 2014-02-16 ENCOUNTER — Encounter (HOSPITAL_COMMUNITY): Payer: Self-pay

## 2014-02-16 ENCOUNTER — Encounter (HOSPITAL_COMMUNITY)
Admission: RE | Admit: 2014-02-16 | Discharge: 2014-02-16 | Disposition: A | Discharge status: Home / Self Care | Payer: 59 | Source: Ambulatory Visit | Attending: Neurology | Admitting: Neurology

## 2014-02-16 VITALS — BP 159/76 | HR 69 | Temp 98.4°F | Resp 18

## 2014-02-16 DIAGNOSIS — G35 Multiple sclerosis: Secondary | ICD-10-CM | POA: Insufficient documentation

## 2014-02-16 MED ORDER — LORATADINE 10 MG PO TABS
10.0000 mg | ORAL_TABLET | ORAL | Status: DC
  Administered 2014-02-16: 10 mg via ORAL
  Filled 2014-02-16: qty 1

## 2014-02-16 MED ORDER — EPINEPHRINE 0.3 MG/0.3ML IJ SOAJ: 0.3000 mg | INTRAMUSCULAR | Status: DC

## 2014-02-16 MED ORDER — SODIUM CHLORIDE 0.9 % IV SOLN
300.0000 mg | INTRAVENOUS | Status: DC
  Administered 2014-02-16: 300 mg via INTRAVENOUS
  Filled 2014-02-16: qty 15

## 2014-02-16 MED ORDER — SODIUM CHLORIDE 0.9 % IV SOLN
INTRAVENOUS | Status: DC
  Administered 2014-02-16: 10:00:00 via INTRAVENOUS

## 2014-02-16 MED ORDER — SODIUM CHLORIDE 0.9 % IV SOLN: 500.0000 mg | INTRAVENOUS | Status: DC

## 2014-02-16 MED ORDER — ACETAMINOPHEN 500 MG PO TABS
1000.0000 mg | ORAL_TABLET | ORAL | Status: DC
  Administered 2014-02-16: 1000 mg via ORAL
  Filled 2014-02-16: qty 2

## 2014-02-18 ENCOUNTER — Telehealth: Payer: Self-pay | Admitting: Neurology

## 2014-02-18 NOTE — Telephone Encounter (Signed)
I have called Alexis Cervantes, that she is taking Balcofen ER 40mg  since Oct 16th, 2015, higher dose does works better for her. Less muscle spasm, but still with leg stiffiness.

## 2014-02-22 ENCOUNTER — Ambulatory Visit (INDEPENDENT_AMBULATORY_CARE_PROVIDER_SITE_OTHER): Payer: Self-pay | Admitting: Neurology

## 2014-02-22 DIAGNOSIS — G35 Multiple sclerosis: Secondary | ICD-10-CM

## 2014-02-22 DIAGNOSIS — R269 Unspecified abnormalities of gait and mobility: Secondary | ICD-10-CM

## 2014-02-22 NOTE — Progress Notes (Signed)
Alexis Cervantes is a 53 year old right-handed black female with a history of relapsing remitting multiple sclerosis, following up of [facility] Trial.  She was diagnosed with multiple sclerosis since Feb 2006.  She presented with right jaw pain, memory loss, fatigue, blurry vision, but no gait difficulty, when she was first diagnosed in 2006. She was treated with Betaseron from 2006 till 2013, then she was treated with Gilenya (from Nov 2013 till June 2015)  She began to have gradually worsening gait problems and difficulty with balance since 2012. She has numbness of the hands and feet,  have some blurred vision, and this improves when she closes her right eye.  She denies problems controlling the bowels or the bladder.   She was in enrolled in [place] trial, was randomized to Gilenya since November 2013. From 2013 till the end of 2014, she is very stable clinical wise, continue have baseline mild gait difficulty, ambulate with cane as needed.     MRI brain: multiple supratentorial (periventricular and subcortical) and infratentorial (cerebellar and pontine) chronic demyelinating plaques. Some of these are hypointense on T1 views. No abnormal lesions are seen on post contrast views.   She had repeat MRI of the brain November 2013, later also in March 2014, July 2014,  hile taking Gilenya, there was no significant change clinical wise or  Image wise.  MRI cervical most recent in 2006 showed  multiple chronic demyelinating plaques at C2, C2-3, C3, C5, C7.  No acute plaques are seen. At C4-5, C5-6, C6-7: Spondylosis and disc bulging with severe biforaminal stenosis.  At C3-4: Disc bulging there is moderate biforaminal stenosis.  She complains of left hip for a while, now no longer hurting her.  UPDATE Feb 2015,   In Jan 26th 2015, she woke up in the middle of the night using bathroom,   noticed dense numbness at bilateral lower extremities, her legs give out underneath her, she was not able to  ambulate.  She was  admitted to the hospital,  treated with iv steroid x 3 days, followed by po tapering, now walking with walker, she is also receiving physical therapy there is mild improvement of her walking, but she complains of bilateral lower extremity paresthesia, weakness, right more than left, no bowel bladder incontinence,     UPDATE June 5th 2015: She has to use walker, has no significant pain, she has no bowel bladder incontinence. JC virus antibody was 0.1 4 in February 2015, was negative.  We have reviewed  MRI scan of the brain showing multiple white matter hyperintensity in scattered throughout the brain stem, cerebellum and subcortical and periventricular white matter typical for multiple sclerosis. No enhancing lesions are noted. The presence of multiple T1 black holes and mild atrophy of corpus callosum indicates chronic disease. Overall progression of white matter changes compared with previous MRI scan dated 06/26/2004.  MRI scan of cervical spine showing mild spondylitic changes but without significant compression. Spinal cord white matter ill-defined hyperintensities throughout represent remote age demyelinating plaques. No enhancing lesions are noted. Slight progression of spinal cord white matter changes compared with MRI scan dated 07/19/2004  She is now on Tysarbri infusion since Sept 21 2015  UPDATE Sep 8th 2015: She has been taking gabapentin as needed basis, last use was June 2015, there was no worsening of her functional status since Gilenya was stopped since June 20 third, 2015, she has been taking baclofen 10 mg 2 tablets 3 times day since June fifth 2015 which did help  her bilateral lower extremity spasticity.  Repeat laboratory evaluation in August showed normal CBC, WBC was 3.5.  UPDATE Oct 16th 2015: [facility], she is on stable dose of baclofen ER 30 mg every night, tolerating it well, less spasticity at evening time, but next morning,  especially during the day, she noticed increased spasticity, muscle spasm achy pain  Today she was noticed to have elevated blood pressure in the range of 170/100, add on lisinopril 10mg  qday  UPDATE Oct 26th 2015: She came evening as unscheduled visit, she reported that higher dose of baclofen ER 40 mg every day works better than 30 mg, she noticed less leg stiffness, no significant side effect,  [facility], she was taking baclofen 10mg  2 tabs tid prior to enrollment (since June 2015), was given Baclofen ER 30mg  since Sept 23 2015, she tolerated the medication very well,  Baclofen ER dosage was increased from 30 to 40 mg each day in February 12 2014, she tolerating it well,   Physical Exam  General:  The patient is moderately obese. Head: normal cephalic Ears, Nose and Throat: normal Neck: Neck is supple, no carotid bruits noted. Respiratory: Respiratory examination is clear. Cardiovascular: Cardiovascular examination reveals a regular rate and rhythm Musculoskeletal: no deformity Skin: Extremities are without significant edema. Trunk: normal  Neurologic Exam  Mental Status: awake, alert, cooperative on examination Cranial Nerves: Facial symmetry is present. fundi sharp bilaterally. extrocular movement was full, visual field is full on confrontational test, facial sensation and strength were normal. Hearing intact bilaterally, head turning and shoulder shrug are normal bilaterally.  Speech is well enunciated, no aphasia or dysarthria is noted.  Tongue protrusion into cheek strength were normal. Motor:  moderate bilateral lower extremity spasticity, mild bilateral hip flexion weakness (R/L), hip flexion 4/5, knee flexion4/5 knee extension 4/5 ankle dorsi flexion 4/4+,  upper extremity motor strength is normal with mild spasticity.. Sensory: decreased pinprick, soft touch, vibratory sensation at right hand, lower extremities, there is a stocking  pattern pinprick sensory deficit up to the knees bilaterally, decreased vibratory sensation to midshin, impaired toe proprioception. Coordination:  finger-to-nose and heel-to-shin were normal bilaterally. Gait and Station: wide based, stiff, unsteady gait, cautious, spastice gait, relies on her walker Reflexes: hyperreflexia of bilateral upper and lower extremity muscles, nonsustained left ankle clonus, plantar responses were flexor at right, extensor at left side.  Assessment and plan: 53 years old [place] female, with history of relapsing remitting multiple sclerosis, was tolerating Gilenya well, but had recent flareup in May 25 2013, presenting with bilateral lower extremity weakness, numbness, increased gait difficulty, previous MRI showed extensive brain, and also cervical lesions, most recent flareup most likely represent a cervical event, repeat MRI of the brain and cervical spine did show progression of the disease in comparison to 2006, her JC virus antibody was negative, with titer of 0.14, after extensive discussion with patient,   We have started Tysarbri infusion since Sept 21st 2015. She was noted to have hypertension over multiple visit, lisinopril 10 mg every day was started February 12 2014, tolerating it well, continue evidence of elevated blood pressure 160/90 today,  Titrating baclofen ER from 40 mg, to 50 mg today, continue follow-up according protocol Laboratory entered including JC virus titer,   If her blood pressure continued to be more than 130 out of 80 on that follow-up visit, we will titrating up her antihypertension medications

## 2014-02-23 ENCOUNTER — Telehealth: Payer: Self-pay

## 2014-02-23 NOTE — Telephone Encounter (Signed)
I called patient this morning and asked if we could move her appointment from this Friday to this Thursday at 9:30 so that the [facility] Evaluator can do her assessment as he is out Friday for a training. She agreed to come in this Thursday instead.

## 2014-02-24 ENCOUNTER — Encounter: Payer: Self-pay | Admitting: Neurology

## 2014-02-25 ENCOUNTER — Ambulatory Visit (INDEPENDENT_AMBULATORY_CARE_PROVIDER_SITE_OTHER): Payer: Self-pay | Admitting: Neurology

## 2014-02-25 DIAGNOSIS — G35 Multiple sclerosis: Secondary | ICD-10-CM

## 2014-02-25 DIAGNOSIS — R269 Unspecified abnormalities of gait and mobility: Secondary | ICD-10-CM

## 2014-02-25 MED ORDER — LISINOPRIL 10 MG PO TABS: 20.0000 mg | ORAL_TABLET | Freq: Every day | ORAL | Status: DC

## 2014-02-25 NOTE — Progress Notes (Addendum)
Alexis Cervantes is a 53 year old right-handed black female with a history of relapsing remitting multiple sclerosis, following up of [facility] Trial.  She was diagnosed with multiple sclerosis since Feb 2006.  She presented with right jaw pain, memory loss, fatigue, blurry vision, but no gait difficulty, when she was first diagnosed in 2006. She was treated with Betaseron from 2006 till 2013, then she was treated with Gilenya (from Nov 2013 till June 2015)  She began to have gradually worsening gait problems and difficulty with balance since 2012. She has numbness of the hands and feet,  have some blurred vision, and this improves when she closes her right eye.  She denies problems controlling the bowels or the bladder.   She was in enrolled in [place] trial, was randomized to Gilenya since November 2013. From 2013 till the end of 2014, she is very stable clinical wise, continue have baseline mild gait difficulty, ambulate with cane as needed.     MRI brain: multiple supratentorial (periventricular and subcortical) and infratentorial (cerebellar and pontine) chronic demyelinating plaques. Some of these are hypointense on T1 views. No abnormal lesions are seen on post contrast views.   She had repeat MRI of the brain November 2013, later also in March 2014, July 2014,  hile taking Gilenya, there was no significant change clinical wise or  Image wise.  MRI cervical most recent in 2006 showed  multiple chronic demyelinating plaques at C2, C2-3, C3, C5, C7.  No acute plaques are seen. At C4-5, C5-6, C6-7: Spondylosis and disc bulging with severe biforaminal stenosis.  At C3-4: Disc bulging there is moderate biforaminal stenosis.  She complains of left hip for a while, now no longer hurting her.  UPDATE Feb 2015,   In Jan 26th 2015, she woke up in the middle of the night using bathroom,   noticed dense numbness at bilateral lower extremities, her legs give out underneath her, she was not able to  ambulate.  She was  admitted to the hospital,  treated with iv steroid x 3 days, followed by po tapering, now walking with walker, she is also receiving physical therapy there is mild improvement of her walking, but she complains of bilateral lower extremity paresthesia, weakness, right more than left, no bowel bladder incontinence,     UPDATE June 5th 2015: She has to use walker, has no significant pain, she has no bowel bladder incontinence. JC virus antibody was 0.1 4 in February 2015, was negative.  We have reviewed  MRI scan of the brain showing multiple white matter hyperintensity in scattered throughout the brain stem, cerebellum and subcortical and periventricular white matter typical for multiple sclerosis. No enhancing lesions are noted. The presence of multiple T1 black holes and mild atrophy of corpus callosum indicates chronic disease. Overall progression of white matter changes compared with previous MRI scan dated 06/26/2004.  MRI scan of cervical spine showing mild spondylitic changes but without significant compression. Spinal cord white matter ill-defined hyperintensities throughout represent remote age demyelinating plaques. No enhancing lesions are noted. Slight progression of spinal cord white matter changes compared with MRI scan dated 07/19/2004  She is now on Tysarbri infusion since Sept 21 2015, Jc virus antibody negative 0.08 in 02/25/2014  UPDATE Sep 8th 2015: She has been taking gabapentin as needed basis, last use was June 2015, there was no worsening of her functional status since Gilenya was stopped since June 20 third, 2015, she has been taking baclofen 10 mg 2 tablets 3 times day  since June fifth 2015 which did help her bilateral lower extremity spasticity.  Repeat laboratory evaluation in August showed normal CBC, WBC was 3.5.  UPDATE Oct 16th 2015: She came in for [facility], she is on stable dose of baclofen ER 30 mg every night, tolerating it well, less  spasticity at evening time, but next morning, especially during the day, she noticed increased spasticity, muscle spasm achy pain  Today she was noticed to have elevated blood pressure in the range of 170/100, add on lisinopril 10mg  qday  UPDATE Oct 26th 2015: She came evening as unscheduled visit, she reported that higher dose of baclofen ER 40 mg every day works better than 30 mg, she noticed less leg stiffness, no significant side effect,  She was enrolled in [facility] 9-21 since Sept 23 2015, she was taking baclofen 10mg  2 tabs tid prior to enrollment (since June 2015), was given Baclofen ER 30mg  since Sept 23 2015, she tolerated the medication very well,  Baclofen ER dosage was increased from 30 to 40 mg each day in February 12 2014, she tolerating it well,  Oct 29th 2015: Keep Baclofen Er 50mg  qhs, bp is 160/80s. Doing well  Physical Exam  General:  The patient is moderately obese. Head: normal cephalic Ears, Nose and Throat: normal Neck: Neck is supple, no carotid bruits noted. Respiratory: Respiratory examination is clear. Cardiovascular: Cardiovascular examination reveals a regular rate and rhythm Musculoskeletal: no deformity Skin: Extremities are without significant edema. Trunk: normal  Neurologic Exam  Mental Status: awake, alert, cooperative on examination Cranial Nerves: Facial symmetry is present. fundi sharp bilaterally. extrocular movement was full, visual field is full on confrontational test, facial sensation and strength were normal. Hearing intact bilaterally, head turning and shoulder shrug are normal bilaterally.  Speech is well enunciated, no aphasia or dysarthria is noted.  Tongue protrusion into cheek strength were normal. Motor:  moderate bilateral lower extremity spasticity, mild bilateral hip flexion weakness (R/L), hip flexion 4/5, knee flexion4/5 knee extension 4/5 ankle dorsi flexion 4/4+,  upper extremity motor strength is normal with mild  spasticity.. Sensory: decreased pinprick, soft touch, vibratory sensation at right hand, lower extremities, there is a stocking pattern pinprick sensory deficit up to the knees bilaterally, decreased vibratory sensation to midshin, impaired toe proprioception. Coordination:  finger-to-nose and heel-to-shin were normal bilaterally. Gait and Station: wide based, stiff, unsteady gait, cautious, spastice gait, relies on her walker Reflexes: hyperreflexia of bilateral upper and lower extremity muscles, nonsustained left ankle clonus, plantar responses were flexor at right, extensor at left side.  Assessment and plan: 53 years old [place] female, with history of relapsing remitting multiple sclerosis, was tolerating Gilenya well, but had recent flareup in May 25 2013, presenting with bilateral lower extremity weakness, numbness, increased gait difficulty, previous MRI showed extensive brain, and also cervical lesions, most recent flareup most likely represent a cervical event, repeat MRI of the brain and cervical spine did show progression of the disease in comparison to 2006, her JC virus antibody was negative, with titer of 0.14, after extensive discussion with patient,   We have started Tysarbri infusion since Sept 21st 2015.    Keep Balcofen ER  50 mg today, continue follow-up according protocol  Increased Lisinopril to 10mg  2 tabs qam. Keep same level of exercise as previously Encourage her to find PCP

## 2014-02-26 ENCOUNTER — Encounter: Payer: Self-pay | Admitting: Neurology

## 2014-02-26 LAB — CBC WITH DIFFERENTIAL
BASOS: 0 %
Basophils Absolute: 0 10*3/uL (ref 0.0–0.2)
EOS ABS: 0.3 10*3/uL (ref 0.0–0.4)
EOS: 5 %
HCT: 35.7 % (ref 34.0–46.6)
Hemoglobin: 11.7 g/dL (ref 11.1–15.9)
Immature Grans (Abs): 0 10*3/uL (ref 0.0–0.1)
Immature Granulocytes: 0 %
Lymphocytes Absolute: 2.6 10*3/uL (ref 0.7–3.1)
Lymphs: 42 %
MCH: 28.2 pg (ref 26.6–33.0)
MCHC: 32.8 g/dL (ref 31.5–35.7)
MCV: 86 fL (ref 79–97)
MONOS ABS: 0.4 10*3/uL (ref 0.1–0.9)
Monocytes: 7 %
Neutrophils Absolute: 2.7 10*3/uL (ref 1.4–7.0)
Neutrophils Relative %: 46 %
Platelets: 316 10*3/uL (ref 150–379)
RBC: 4.15 x10E6/uL (ref 3.77–5.28)
RDW: 14.7 % (ref 12.3–15.4)
WBC: 6 10*3/uL (ref 3.4–10.8)

## 2014-02-26 LAB — COMPREHENSIVE METABOLIC PANEL
A/G RATIO: 1.5 (ref 1.1–2.5)
ALT: 9 IU/L (ref 0–32)
AST: 16 IU/L (ref 0–40)
Albumin: 4.3 g/dL (ref 3.5–5.5)
Alkaline Phosphatase: 127 IU/L — ABNORMAL HIGH (ref 39–117)
BILIRUBIN TOTAL: 0.3 mg/dL (ref 0.0–1.2)
BUN/Creatinine Ratio: 16 (ref 9–23)
BUN: 16 mg/dL (ref 6–24)
CALCIUM: 9.7 mg/dL (ref 8.7–10.2)
CO2: 25 mmol/L (ref 18–29)
CREATININE: 1.02 mg/dL — AB (ref 0.57–1.00)
Chloride: 103 mmol/L (ref 97–108)
GFR calc Af Amer: 72 mL/min/{1.73_m2} (ref >59)
GFR, EST NON AFRICAN AMERICAN: 63 mL/min/{1.73_m2} (ref >59)
GLOBULIN, TOTAL: 2.8 g/dL (ref 1.5–4.5)
GLUCOSE: 93 mg/dL (ref 65–99)
Potassium: 4.1 mmol/L (ref 3.5–5.2)
Sodium: 142 mmol/L (ref 134–144)
TOTAL PROTEIN: 7.1 g/dL (ref 6.0–8.5)

## 2014-03-02 ENCOUNTER — Telehealth: Payer: Self-pay

## 2014-03-02 NOTE — Progress Notes (Signed)
Quick Note:  Called and Spoke to patient relayed labs. Patient understood. ______

## 2014-03-02 NOTE — Telephone Encounter (Signed)
I called patient this afternoon and advised it is okay for her to take Gabapentin as needed. Patient had no further questions.

## 2014-03-02 NOTE — Telephone Encounter (Signed)
Reviewed note of adding Gabapentin, already checked with sponsor per note by Amy.

## 2014-03-02 NOTE — Progress Notes (Signed)
Quick Note:  Called and spoke to patient normal labs. Patient understood. ______ 

## 2014-03-02 NOTE — Telephone Encounter (Signed)
Patient called this morning. She is enrolled in [facility] 09_21 MS study. She is having "electrical impulses" in her mouth which is painful.  She was asking if she can use Gabapentin as she has before.  I checked the study protocol and also reviewed a letter from [facility] which indicates that it is okay to use this while enrolled. I will call patient back and let her know after sending a message to Dr. Terrace Arabia so that she is aware.

## 2014-03-19 ENCOUNTER — Telehealth: Payer: Self-pay | Admitting: Neurology

## 2014-03-19 MED ORDER — AMLODIPINE BESYLATE 10 MG PO TABS: 10.0000 mg | ORAL_TABLET | Freq: Every day | ORAL | Status: DC

## 2014-03-19 NOTE — Telephone Encounter (Signed)
I have called her, since lisinopril, she has frequent cough, she was able to afford a bp cuff.  I have suggest her to taper lisinopril 10mg  in one week, titrating Norvasc 10mg  qday.

## 2014-03-22 ENCOUNTER — Telehealth: Payer: Self-pay | Admitting: Neurology

## 2014-03-22 ENCOUNTER — Encounter (HOSPITAL_COMMUNITY)
Admission: RE | Admit: 2014-03-22 | Discharge: 2014-03-22 | Disposition: A | Discharge status: Home / Self Care | Payer: 59 | Source: Ambulatory Visit | Attending: Neurology | Admitting: Neurology

## 2014-03-22 VITALS — BP 136/76 | HR 68 | Temp 98.2°F | Resp 18

## 2014-03-22 DIAGNOSIS — G35 Multiple sclerosis: Secondary | ICD-10-CM | POA: Diagnosis not present

## 2014-03-22 MED ORDER — SODIUM CHLORIDE 0.9 % IV SOLN
300.0000 mg | INTRAVENOUS | Status: AC
  Administered 2014-03-22: 300 mg via INTRAVENOUS
  Filled 2014-03-22: qty 15

## 2014-03-22 MED ORDER — LORATADINE 10 MG PO TABS
10.0000 mg | ORAL_TABLET | ORAL | Status: AC
Start: 2014-03-22 — End: 2014-03-22
  Administered 2014-03-22: 10 mg via ORAL
  Filled 2014-03-22: qty 1

## 2014-03-22 MED ORDER — ACETAMINOPHEN 500 MG PO TABS
1000.0000 mg | ORAL_TABLET | ORAL | Status: AC
  Administered 2014-03-22: 1000 mg via ORAL
  Filled 2014-03-22: qty 2

## 2014-03-22 MED ORDER — SODIUM CHLORIDE 0.9 % IV SOLN
INTRAVENOUS | Status: AC
  Administered 2014-03-22: 11:00:00 via INTRAVENOUS

## 2014-03-22 NOTE — Telephone Encounter (Signed)
Patient called about her appointment on [id number]. I confirmed her appointment.

## 2014-03-22 NOTE — Telephone Encounter (Signed)
I have called Alexis Cervantes, she is ready for her Tysarbri infusion, she did get her blood pressure pills, Norvasc, tapering down the Lisinopril

## 2014-03-24 ENCOUNTER — Ambulatory Visit (INDEPENDENT_AMBULATORY_CARE_PROVIDER_SITE_OTHER): Payer: Self-pay | Admitting: Neurology

## 2014-03-24 DIAGNOSIS — G35 Multiple sclerosis: Secondary | ICD-10-CM

## 2014-03-24 NOTE — Progress Notes (Signed)
Alexis Cervantes is a 53 year old right-handed black female with a history of relapsing remitting multiple sclerosis, following up of [facility] Trial.  She was diagnosed with multiple sclerosis since Feb 2006.  She presented with right jaw pain, memory loss, fatigue, blurry vision, but no gait difficulty, when she was first diagnosed in 2006. She was treated with Betaseron from 2006 till 2013, then she was treated with Gilenya (from Nov 2013 till June 2015)  She began to have gradually worsening gait problems and difficulty with balance since 2012. She has numbness of the hands and feet,  have some blurred vision, and this improves when she closes her right eye.  She denies problems controlling the bowels or the bladder.   She was in enrolled in [place] trial, was randomized to Gilenya since November 2013. From 2013 till the end of 2014, she is very stable clinical wise, continue have baseline mild gait difficulty, ambulate with cane as needed.     MRI brain: multiple supratentorial (periventricular and subcortical) and infratentorial (cerebellar and pontine) chronic demyelinating plaques. Some of these are hypointense on T1 views. No abnormal lesions are seen on post contrast views.   She had repeat MRI of the brain November 2013, later also in March 2014, July 2014,  hile taking Gilenya, there was no significant change clinical wise or  Image wise.  MRI cervical most recent in 2006 showed  multiple chronic demyelinating plaques at C2, C2-3, C3, C5, C7.  No acute plaques are seen. At C4-5, C5-6, C6-7: Spondylosis and disc bulging with severe biforaminal stenosis.  At C3-4: Disc bulging there is moderate biforaminal stenosis.  She complains of left hip for a while, now no longer hurting her.  UPDATE Feb 2015,   In Jan 26th 2015, she woke up in the middle of the night using bathroom,   noticed dense numbness at bilateral lower extremities, her legs give out underneath her, she was not able to  ambulate.  She was  admitted to the hospital,  treated with iv steroid x 3 days, followed by po tapering, now walking with walker, she is also receiving physical therapy there is mild improvement of her walking, but she complains of bilateral lower extremity paresthesia, weakness, right more than left, no bowel bladder incontinence,     UPDATE June 5th 2015: She has to use walker, has no significant pain, she has no bowel bladder incontinence. JC virus antibody was 0.1 4 in February 2015, was negative.  We have reviewed  MRI scan of the brain showing multiple white matter hyperintensity in scattered throughout the brain stem, cerebellum and subcortical and periventricular white matter typical for multiple sclerosis. No enhancing lesions are noted. The presence of multiple T1 black holes and mild atrophy of corpus callosum indicates chronic disease. Overall progression of white matter changes compared with previous MRI scan dated 06/26/2004.  MRI scan of cervical spine showing mild spondylitic changes but without significant compression. Spinal cord white matter ill-defined hyperintensities throughout represent remote age demyelinating plaques. No enhancing lesions are noted. Slight progression of spinal cord white matter changes compared with MRI scan dated 07/19/2004  She is now on Tysarbri infusion since Sept 21 2015, Jc virus antibody negative 0.08 in 02/25/2014  UPDATE Sep 8th 2015: She has been taking gabapentin as needed basis, last use was June 2015, there was no worsening of her functional status since Gilenya was stopped since June 20 third, 2015, she has been taking baclofen 10 mg 2 tablets 3 times day  since June fifth 2015 which did help her bilateral lower extremity spasticity.  Repeat laboratory evaluation in August showed normal CBC, WBC was 3.5.  UPDATE Oct 16th 2015: She came [facility], she is on stable dose of baclofen ER 30 mg every night, tolerating it well, less  spasticity at evening time, but next morning, especially during the day, she noticed increased spasticity, muscle spasm achy pain  Today she was noticed to have elevated blood pressure in the range of 170/100, add on lisinopril 10mg  qday  UPDATE Oct 26th 2015: She came evening as unscheduled visit, she reported that higher dose of baclofen ER 40 mg every day works better than 30 mg, she noticed less leg stiffness, no significant side effect,  She was [place] in [facility] since Sept 23 2015, she was taking baclofen 10mg  2 tabs tid prior to enrollment (since June 2015), was given Baclofen ER 30mg  since Sept 23 2015, she tolerated the medication very well,  Baclofen ER dosage was increased from 30 to 40 mg each day in February 12 2014, she tolerating it well,  Oct 29th 2015: Keep Baclofen Er 50mg  qhs, bp is 160/80s. Doing well  Nov 26th 2015: She complains of cough with lisinopril 10 mg twice a day, no switched to norvasc 10 mg daily, doing very well  Physical Exam  General:  The patient is moderately obese. Head: normal cephalic Ears, Nose and Throat: normal Neck: Neck is supple, no carotid bruits noted. Respiratory: Respiratory examination is clear. Cardiovascular: Cardiovascular examination reveals a regular rate and rhythm Musculoskeletal: no deformity Skin: Extremities are without significant edema. Trunk: normal  Neurologic Exam  Mental Status: awake, alert, cooperative on examination Cranial Nerves: Facial symmetry is present. fundi sharp bilaterally. extrocular movement was full, visual field is full on confrontational test, facial sensation and strength were normal. Hearing intact bilaterally, head turning and shoulder shrug are normal bilaterally.  Speech is well enunciated, no aphasia or dysarthria is noted.  Tongue protrusion into cheek strength were normal. Motor:  moderate bilateral lower extremity spasticity, mild bilateral hip flexion weakness (R/L), hip flexion 4/5,  knee flexion4/5 knee extension 4/5 ankle dorsi flexion 4/4+,  upper extremity motor strength is normal with mild spasticity.. Sensory: decreased pinprick, soft touch, vibratory sensation at right hand, lower extremities, there is a stocking pattern pinprick sensory deficit up to the knees bilaterally, decreased vibratory sensation to midshin, impaired toe proprioception. Coordination:  finger-to-nose and heel-to-shin were normal bilaterally. Gait and Station: wide based, stiff, unsteady gait, cautious, spastice gait, relies on her walker Reflexes: hyperreflexia of bilateral upper and lower extremity muscles, nonsustained left ankle clonus, plantar responses were flexor at right, extensor at left side.  Assessment and plan: 53 years old [place] female, with history of relapsing remitting multiple sclerosis, was tolerating Gilenya well, but had recent flareup in May 25 2013, presenting with bilateral lower extremity weakness, numbness, increased gait difficulty, previous MRI showed extensive brain, and also cervical lesions, most recent flareup most likely represent a cervical event, repeat MRI of the brain and cervical spine did show progression of the disease in comparison to 2006, her JC virus antibody was negative, with titer of 0.14, after extensive discussion with patient,   We have started Tysarbri infusion since Sept 21st 2015.   Keep Balcofen ER  50 mg today, continue follow-up according protocol  She is now on Norvasc 10mg  qday

## 2014-03-30 ENCOUNTER — Telehealth: Payer: Self-pay | Admitting: Neurology

## 2014-03-30 NOTE — Telephone Encounter (Signed)
I have called Alexis Cervantes, she still has cough, she stopped the lisinopril around November 20 third,   Overall her cough has improved, her sister had a similar side effect with ACE inhibitor, it takes her about 4 weeks to improve, it is in line with the literature suggested  Continue Norvasc, follow-up according to plan

## 2014-04-13 ENCOUNTER — Telehealth: Payer: Self-pay | Admitting: Neurology

## 2014-04-13 ENCOUNTER — Other Ambulatory Visit: Payer: Self-pay | Admitting: Neurology

## 2014-04-13 DIAGNOSIS — G35 Multiple sclerosis: Secondary | ICD-10-CM

## 2014-04-13 NOTE — Telephone Encounter (Signed)
Order for Tysarbri was placed 

## 2014-04-13 NOTE — Telephone Encounter (Signed)
Dee with [facility] @ [phone], requesting Tysabri Order, patient scheduled 12/16.

## 2014-04-13 NOTE — Telephone Encounter (Signed)
-----   Message from Katherene Ponto, [place] sent at 04/13/2014 11:52 AM EST ----- Regarding: Tysabri Orders Good morning Dr. Terrace Arabia. My name is Geraldine Contras from [employer] [facility]). This message is in regards to your patient, Ms. Alexis Cervantes; whom is due to receive her Tysabri infusion at our facility.This is a request for new Tysabri orders in order for [facility] infuse [name]. Otherwise we would have to cancel and/or reschedule [name] until orders are received. Thank you in advance for your service in assuring we have required necessities in order to serve our and your patients with [facility].

## 2014-04-20 ENCOUNTER — Encounter: Payer: Self-pay | Admitting: Neurology

## 2014-04-20 ENCOUNTER — Ambulatory Visit (INDEPENDENT_AMBULATORY_CARE_PROVIDER_SITE_OTHER): Payer: Self-pay | Admitting: Neurology

## 2014-04-20 DIAGNOSIS — G35 Multiple sclerosis: Secondary | ICD-10-CM

## 2014-04-20 DIAGNOSIS — R269 Unspecified abnormalities of gait and mobility: Secondary | ICD-10-CM

## 2014-04-20 NOTE — Progress Notes (Signed)
Alexis Cervantes is a 53 year old right-handed black female with a history of relapsing remitting multiple sclerosis, following up of [facility] Trial.  She was diagnosed with multiple sclerosis since Feb 2006.  She presented with right jaw pain, memory loss, fatigue, blurry vision, but no gait difficulty, when she was first diagnosed in 2006. She was treated with Betaseron from 2006 till 2013, then she was treated with Gilenya (from Nov 2013 till June 2015)  She began to have gradually worsening gait problems and difficulty with balance since 2012. She has numbness of the hands and feet,  have some blurred vision, and this improves when she closes her right eye.  She denies problems controlling the bowels or the bladder.   She was in enrolled in [place] trial, was randomized to Gilenya since November 2013. From 2013 till the end of 2014, she is very stable clinical wise, continue have baseline mild gait difficulty, ambulate with cane as needed.     MRI brain: multiple supratentorial (periventricular and subcortical) and infratentorial (cerebellar and pontine) chronic demyelinating plaques. Some of these are hypointense on T1 views. No abnormal lesions are seen on post contrast views.   She had repeat MRI of the brain November 2013, later also in March 2014, July 2014,  hile taking Gilenya, there was no significant change clinical wise or  Image wise.  MRI cervical most recent in 2006 showed  multiple chronic demyelinating plaques at C2, C2-3, C3, C5, C7.  No acute plaques are seen. At C4-5, C5-6, C6-7: Spondylosis and disc bulging with severe biforaminal stenosis.  At C3-4: Disc bulging there is moderate biforaminal stenosis.  She complains of left hip for a while, now no longer hurting her.  UPDATE Feb 2015,   In Jan 26th 2015, she woke up in the middle of the night using bathroom,   noticed dense numbness at bilateral lower extremities, her legs give out underneath her, she was not able to  ambulate.  She was  admitted to the hospital,  treated with iv steroid x 3 days, followed by po tapering, now walking with walker, she is also receiving physical therapy there is mild improvement of her walking, but she complains of bilateral lower extremity paresthesia, weakness, right more than left, no bowel bladder incontinence,     UPDATE June 5th 2015: She has to use walker, has no significant pain, she has no bowel bladder incontinence. JC virus antibody was 0.1 4 in February 2015, was negative.  We have reviewed  MRI scan of the brain showing multiple white matter hyperintensity in scattered throughout the brain stem, cerebellum and subcortical and periventricular white matter typical for multiple sclerosis. No enhancing lesions are noted. The presence of multiple T1 black holes and mild atrophy of corpus callosum indicates chronic disease. Overall progression of white matter changes compared with previous MRI scan dated 06/26/2004.  MRI scan of cervical spine showing mild spondylitic changes but without significant compression. Spinal cord white matter ill-defined hyperintensities throughout represent remote age demyelinating plaques. No enhancing lesions are noted. Slight progression of spinal cord white matter changes compared with MRI scan dated 07/19/2004  She is now on Tysarbri infusion since Sept 21 2015, Jc virus antibody negative 0.08 in 02/25/2014  UPDATE Sep 8th 2015: She has been taking gabapentin as needed basis, last use was June 2015, there was no worsening of her functional status since Gilenya was stopped since June 20 third, 2015, she has been taking baclofen 10 mg 2 tablets 3 times day  since June fifth 2015 which did help her bilateral lower extremity spasticity.  Repeat laboratory evaluation in August showed normal CBC, WBC was 3.5.  UPDATE Oct 16th 2015: She came in for [facility] trial, she is on stable dose of baclofen ER 30 mg every night, tolerating it well, less  spasticity at evening time, but next morning, especially during the day, she noticed increased spasticity, muscle spasm achy pain  Today she was noticed to have elevated blood pressure in the range of 170/100, add on lisinopril 10mg  qday  UPDATE Oct 26th 2015: She came evening as unscheduled visit, she reported that higher dose of baclofen ER 40 mg every day works better than 30 mg, she noticed less leg stiffness, no significant side effect,  She was enrolled in [place] 9-21 since Sept 23 2015, she was taking baclofen 10mg  2 tabs tid prior to enrollment (since June 2015), was given Baclofen ER 30mg  since Sept 23 2015, she tolerated the medication very well,  Baclofen ER dosage was increased from 30 to 40 mg each day in February 12 2014, she tolerating it well,  Oct 29th 2015: Keep Baclofen Er 50mg  qhs, bp is 160/80s. Doing well  Nov 26th 2015: She complains of cough with lisinopril 10 mg twice a day, no switched to norvasc 10 mg daily, doing very well  UPDATE Dec 22nd 2015: She is here according to protocol, tolerating baclofen ER 50 mg every night, without significant muscle spasm, she is now taking to Norvasc 10 mg every day, blood pressure is under better control now, no longer has cough  Physical Exam  General:  The patient is moderately obese. Head: normal cephalic Ears, Nose and Throat: normal Neck: Neck is supple, no carotid bruits noted. Respiratory: Respiratory examination is clear. Cardiovascular: Cardiovascular examination reveals a regular rate and rhythm Musculoskeletal: no deformity Skin: Extremities are without significant edema. Trunk: normal  Neurologic Exam  Mental Status: awake, alert, cooperative on examination Cranial Nerves: Facial symmetry is present. fundi sharp bilaterally. extrocular movement was full, visual field is full on confrontational test, facial sensation and strength were normal. Hearing intact bilaterally, head turning and shoulder shrug are  normal bilaterally.  Speech is well enunciated, no aphasia or dysarthria is noted.  Tongue protrusion into cheek strength were normal. Motor:  moderate bilateral lower extremity spasticity, mild bilateral hip flexion weakness (R/L), hip flexion 4/5, knee flexion4/5 knee extension 4/5 ankle dorsi flexion 4/4+,  upper extremity motor strength is normal with mild spasticity.. Sensory: decreased pinprick, soft touch, vibratory sensation at right hand, lower extremities, there is a stocking pattern pinprick sensory deficit up to the knees bilaterally, decreased vibratory sensation to midshin, impaired toe proprioception. Coordination:  finger-to-nose and heel-to-shin were normal bilaterally. Gait and Station: wide based, stiff, unsteady gait, cautious, spastice gait, relies on her walker Reflexes: hyperreflexia of bilateral upper and lower extremity muscles, nonsustained left ankle clonus, plantar responses were flexor at right, extensor at left side.  Assessment and plan: 53 years old [place] American female, with history of relapsing remitting multiple sclerosis, was tolerating Gilenya well, but had recent flareup in May 25 2013, presenting with bilateral lower extremity weakness, numbness, increased gait difficulty, previous MRI showed extensive brain, and also cervical lesions, most recent flareup most likely represent a cervical event, repeat MRI of the brain and cervical spine did show progression of the disease in comparison to 2006, her JC virus antibody was negative, with titer of 0.14, after extensive discussion with patient,   We have started  Tysarbri infusion since Sept 21st 2015.   Keep Balcofen ER  50 mg today, continue follow-up according protocol  She is now on Norvasc 10mg  qday

## 2014-04-21 ENCOUNTER — Encounter (HOSPITAL_COMMUNITY)
Admission: RE | Admit: 2014-04-21 | Discharge: 2014-04-21 | Disposition: A | Discharge status: Home / Self Care | Payer: 59 | Source: Ambulatory Visit | Attending: Neurology | Admitting: Neurology

## 2014-04-21 ENCOUNTER — Encounter (HOSPITAL_COMMUNITY): Payer: Self-pay

## 2014-04-21 VITALS — BP 133/69 | HR 73 | Temp 98.4°F | Resp 16

## 2014-04-21 DIAGNOSIS — G35 Multiple sclerosis: Secondary | ICD-10-CM | POA: Insufficient documentation

## 2014-04-21 MED ORDER — ACETAMINOPHEN 500 MG PO TABS
1000.0000 mg | ORAL_TABLET | ORAL | Status: DC
  Administered 2014-04-21: 1000 mg via ORAL
  Filled 2014-04-21: qty 2

## 2014-04-21 MED ORDER — LORATADINE 10 MG PO TABS
10.0000 mg | ORAL_TABLET | ORAL | Status: DC
  Administered 2014-04-21: 10 mg via ORAL
  Filled 2014-04-21: qty 1

## 2014-04-21 MED ORDER — SODIUM CHLORIDE 0.9 % IV SOLN
300.0000 mg | INTRAVENOUS | Status: DC
  Administered 2014-04-21: 300 mg via INTRAVENOUS
  Filled 2014-04-21: qty 15

## 2014-04-21 MED ORDER — SODIUM CHLORIDE 0.9 % IV SOLN
INTRAVENOUS | Status: DC
  Administered 2014-04-21: 13:00:00 via INTRAVENOUS

## 2014-04-21 NOTE — Discharge Instructions (Signed)
Tysabri °Natalizumab injection °What is this medicine? °NATALIZUMAB (na ta LIZ you mab) is used to treat relapsing multiple sclerosis. This drug is not a cure. It is also used to treat Crohn's disease. °This medicine may be used for other purposes; ask your health care provider or pharmacist if you have questions. °COMMON BRAND NAME(S): Tysabri °What should I tell my health care provider before I take this medicine? °They need to know if you have any of these conditions: °-immune system problems °-progressive multifocal leukoencephalopathy (PML) °-an unusual or allergic reaction to natalizumab, other medicines, foods, dyes, or preservatives °-pregnant or trying to get pregnant °-breast-feeding °How should I use this medicine? °This medicine is for infusion into a vein. It is given by a health care professional in a hospital or clinic setting. °A special MedGuide will be given to you by the pharmacist with each prescription and refill. Be sure to read this information carefully each time. °Talk to your pediatrician regarding the use of this medicine in children. This medicine is not approved for use in children. °Overdosage: If you think you have taken too much of this medicine contact a poison control center or emergency room at once. °NOTE: This medicine is only for you. Do not share this medicine with others. °What if I miss a dose? °It is important not to miss your dose. Call your doctor or health care professional if you are unable to keep an appointment. °What may interact with this medicine? °-azathioprine °-cyclosporine °-interferon °-6-mercaptopurine °-methotrexate °-steroid medicines like prednisone or cortisone °-TNF-alpha inhibitors like adalimumab, etanercept, and infliximab °-vaccines °This list may not describe all possible interactions. Give your health care provider a list of all the medicines, herbs, non-prescription drugs, or dietary supplements you use. Also tell them if you smoke, drink alcohol,  or use illegal drugs. Some items may interact with your medicine. °What should I watch for while using this medicine? °Your condition will be monitored carefully while you are receiving this medicine. Visit your doctor for regular check ups. Tell your doctor or healthcare professional if your symptoms do not start to get better or if they get worse. °Stay away from people who are sick. Call your doctor or health care professional for advice if you get a fever, chills or sore throat, or other symptoms of a cold or flu. Do not treat yourself. °In some patients, this medicine may cause a serious brain infection that may cause death. If you have any problems seeing, thinking, speaking, walking, or standing, tell your doctor right away. If you cannot reach your doctor, get urgent medical care. °What side effects may I notice from receiving this medicine? °Side effects that you should report to your doctor or health care professional as soon as possible: °-allergic reactions like skin rash, itching or hives, swelling of the face, lips, or tongue °-breathing problems °-changes in vision °-chest pain °-dark urine °-depression, feelings of sadness °-dizziness °-general ill feeling or flu-like symptoms °-irregular, missed, or painful menstrual periods °-light-colored stools °-loss of appetite, nausea °-muscle weakness °-problems with balance, talking, or walking °-right upper belly pain °-unusually weak or tired °-yellowing of the eyes or skin °Side effects that usually do not require medical attention (report to your doctor or health care professional if they continue or are bothersome): °-aches, pains °-headache °-stomach upset °-tiredness °This list may not describe all possible side effects. Call your doctor for medical advice about side effects. You may report side effects to FDA at 1-800-FDA-1088. °Where should I   keep my medicine? °This drug is given in a hospital or clinic and will not be stored at home. °NOTE: This  sheet is a summary. It may not cover all possible information. If you have questions about this medicine, talk to your doctor, pharmacist, or health care provider. °© 2015, [employer]vier/[employer]. (2008-06-05 13:33:21) ° °

## 2014-05-19 ENCOUNTER — Encounter (HOSPITAL_COMMUNITY): Payer: 59

## 2014-05-20 ENCOUNTER — Ambulatory Visit (INDEPENDENT_AMBULATORY_CARE_PROVIDER_SITE_OTHER): Payer: Self-pay | Admitting: Neurology

## 2014-05-20 ENCOUNTER — Encounter (HOSPITAL_COMMUNITY): Payer: Self-pay

## 2014-05-20 ENCOUNTER — Encounter (HOSPITAL_COMMUNITY)
Admission: RE | Admit: 2014-05-20 | Discharge: 2014-05-20 | Disposition: A | Discharge status: Home / Self Care | Payer: 59 | Source: Ambulatory Visit | Attending: Neurology | Admitting: Neurology

## 2014-05-20 VITALS — BP 132/84 | HR 68 | Temp 98.4°F | Resp 16

## 2014-05-20 VITALS — BP 112/63 | HR 69 | Temp 98.0°F | Resp 16

## 2014-05-20 DIAGNOSIS — G35 Multiple sclerosis: Secondary | ICD-10-CM | POA: Insufficient documentation

## 2014-05-20 DIAGNOSIS — R269 Unspecified abnormalities of gait and mobility: Secondary | ICD-10-CM

## 2014-05-20 MED ORDER — LORATADINE 10 MG PO TABS
10.0000 mg | ORAL_TABLET | ORAL | Status: DC
  Administered 2014-05-20: 10 mg via ORAL
  Filled 2014-05-20: qty 1

## 2014-05-20 MED ORDER — ACETAMINOPHEN 500 MG PO TABS
1000.0000 mg | ORAL_TABLET | ORAL | Status: DC
  Administered 2014-05-20: 1000 mg via ORAL
  Filled 2014-05-20: qty 2

## 2014-05-20 MED ORDER — SODIUM CHLORIDE 0.9 % IV SOLN
300.0000 mg | INTRAVENOUS | Status: DC
  Administered 2014-05-20: 300 mg via INTRAVENOUS
  Filled 2014-05-20: qty 15

## 2014-05-20 MED ORDER — SODIUM CHLORIDE 0.9 % IV SOLN
INTRAVENOUS | Status: DC
  Administered 2014-05-20: 250 mL via INTRAVENOUS

## 2014-05-20 NOTE — Progress Notes (Signed)
Alexis Cervantes is a 54 year old right-handed black female with a history of relapsing remitting multiple sclerosis, following up of [facility] Trial.  She was diagnosed with multiple sclerosis since Feb 2006.  She presented with right jaw pain, memory loss, fatigue, blurry vision, but no gait difficulty, when she was first diagnosed in 2006. She was treated with Betaseron from 2006 till 2013, then she was treated with Gilenya (from Nov 2013 till June 2015)  She began to have gradually worsening gait problems and difficulty with balance since 2012. She has numbness of the hands and feet,  have some blurred vision, and this improves when she closes her right eye.  She denies problems controlling the bowels or the bladder.   She was in enrolled in [place] trial, was randomized to Gilenya since November 2013. From 2013 till the end of 2014, she is very stable clinical wise, continue have baseline mild gait difficulty, ambulate with cane as needed.     MRI brain: multiple supratentorial (periventricular and subcortical) and infratentorial (cerebellar and pontine) chronic demyelinating plaques. Some of these are hypointense on T1 views. No abnormal lesions are seen on post contrast views.   She had repeat MRI of the brain November 2013, later also in March 2014, July 2014,  hile taking Gilenya, there was no significant change clinical wise or  Image wise.  MRI cervical most recent in 2006 showed  multiple chronic demyelinating plaques at C2, C2-3, C3, C5, C7.  No acute plaques are seen. At C4-5, C5-6, C6-7: Spondylosis and disc bulging with severe biforaminal stenosis.  At C3-4: Disc bulging there is moderate biforaminal stenosis.  She complains of left hip for a while, now no longer hurting her.  UPDATE Feb 2015,   In Jan 26th 2015, she woke up in the middle of the night using bathroom,   noticed dense numbness at bilateral lower extremities, her legs give out underneath her, she was not able to  ambulate.  She was  admitted to the hospital,  treated with iv steroid x 3 days, followed by po tapering, now walking with walker, she is also receiving physical therapy there is mild improvement of her walking, but she complains of bilateral lower extremity paresthesia, weakness, right more than left, no bowel bladder incontinence,     UPDATE June 5th 2015: She has to use walker, has no significant pain, she has no bowel bladder incontinence. JC virus antibody was 0.1 4 in February 2015, was negative.  We have reviewed  MRI scan of the brain showing multiple white matter hyperintensity in scattered throughout the brain stem, cerebellum and subcortical and periventricular white matter typical for multiple sclerosis. No enhancing lesions are noted. The presence of multiple T1 black holes and mild atrophy of corpus callosum indicates chronic disease. Overall progression of white matter changes compared with previous MRI scan dated 06/26/2004.  MRI scan of cervical spine showing mild spondylitic changes but without significant compression. Spinal cord white matter ill-defined hyperintensities throughout represent remote age demyelinating plaques. No enhancing lesions are noted. Slight progression of spinal cord white matter changes compared with MRI scan dated 07/19/2004  She is now on Tysarbri infusion since Sept 21 2015, Jc virus antibody negative 0.08 in 02/25/2014  UPDATE Sep 8th 2015: She has been taking gabapentin as needed basis, last use was June 2015, there was no worsening of her functional status since Gilenya was stopped since June 20 third, 2015, she has been taking baclofen 10 mg 2 tablets 3 times day  since June fifth 2015 which did help her bilateral lower extremity spasticity.  Repeat laboratory evaluation in August showed normal CBC, WBC was 3.5.  UPDATE Oct 16th 2015: She came in for [facility] trial, she is on stable dose of baclofen ER 30 mg every night, tolerating it well, less  spasticity at evening time, but next morning, especially during the day, she noticed increased spasticity, muscle spasm achy pain  Today she was noticed to have elevated blood pressure in the range of 170/100, add on lisinopril 10mg  qday  UPDATE Oct 26th 2015: She came evening as unscheduled visit, she reported that higher dose of baclofen ER 40 mg every day works better than 30 mg, she noticed less leg stiffness, no significant side effect,  She was enrolled in [place] 9-21 since Sept 23 2015, she was taking baclofen 10mg  2 tabs tid prior to enrollment (since June 2015), was given Baclofen ER 30mg  since Sept 23 2015, she tolerated the medication very well,  Baclofen ER dosage was increased from 30 to 40 mg each day in February 12 2014, she tolerating it well,  Oct 29th 2015: Keep Baclofen Er 50mg  qhs, bp is 160/80s. Doing well  Nov 26th 2015: She complains of cough with lisinopril 10 mg twice a day, no switched to norvasc 10 mg daily, doing very well  UPDATE Dec 22nd 2015: She is here according to protocol, tolerating baclofen ER 50 mg every night, without significant muscle spasm, she is now taking to Norvasc 10 mg every day, blood pressure is under better control now, no longer has cough  UPDATE May 20 2014: This is visit 7, she has been compliant with her medications, and randomization today  Physical Exam  General:  The patient is moderately obese. Head: normal cephalic Ears, Nose and Throat: normal Neck: Neck is supple, no carotid bruits noted. Respiratory: Respiratory examination is clear. Cardiovascular: Cardiovascular examination reveals a regular rate and rhythm Musculoskeletal: no deformity Skin: Extremities are without significant edema. Trunk: normal  Neurologic Exam  Mental Status: awake, alert, cooperative on examination Cranial Nerves: Facial symmetry is present. fundi sharp bilaterally. extrocular movement was full, visual field is full on confrontational test,  facial sensation and strength were normal. Hearing intact bilaterally, head turning and shoulder shrug are normal bilaterally.  Speech is well enunciated, no aphasia or dysarthria is noted.  Tongue protrusion into cheek strength were normal. Motor:  moderate bilateral lower extremity spasticity, mild bilateral hip flexion weakness (R/L), hip flexion 4/4-, knee flexion4/4- knee extension 4/4- ankle dorsi flexion 4/3,  upper extremity motor strength is normal with mild spasticity.. Sensory: decreased pinprick, soft touch, vibratory sensation at right hand, lower extremities, there is a stocking pattern pinprick sensory deficit up to the knees bilaterally, decreased vibratory sensation to midshin, impaired toe proprioception. Coordination:  finger-to-nose and heel-to-shin were normal bilaterally. Gait and Station: wide based, stiff, unsteady gait, cautious, spastice gait, relies on her walker Reflexes: hyperreflexia of bilateral upper and lower extremity muscles, nonsustained left ankle clonus, plantar responses were flexor at right, extensor at left side.  Assessment and plan: 54 years old [place] American female, with history of relapsing remitting multiple sclerosis, was tolerating Gilenya well, but had recent flareup in May 25 2013, presenting with bilateral lower extremity weakness, numbness, increased gait difficulty, previous MRI showed extensive brain, and also cervical lesions, most recent flareup most likely represent a cervical event, repeat MRI of the brain and cervical spine did show progression of the disease in comparison to 2006, her  JC virus antibody was negative, with titer of 0.14, after extensive discussion with patient,   We have started Tysarbri infusion since Sept 21st 2015. Jc virus antibody was negative  continue follow-up according protocol, randomization today  She is now on Norvasc  qday, BP is under good control 130/80s.  Levert Feinstein, M.D. Ph.D.  [facility] [address] [place], [place] [address] Phone: [phone] Fax:      [phone]

## 2014-05-20 NOTE — Progress Notes (Addendum)
Alexis Cervantes was seen today [id number] for Visit 7 for the [facility] [id number] Research Trial: A Placebo-Controlled Randomized withdrawal evaluation of the efficacy and safety of Baclofen ER capsules (GRS) in subjects with Spasticity due to Multiple Sclerosis. Patient was accompanied by the daughter. Vital signs were found non-clinically-significant. A 12 lead EKG was performed in triplicate at two minute intervals (10:43h, 10:45h, and 10:47h). Clinical laboratory tests and PK sampling were also performed. Clinical Global Impression of Change and Subject Global Impression of Severity were performed by Dr. Marcial Pacas and results did not exhibit significant findings in spasticity severity. C-SSRS was performed by Ena Dawley, CRC. Patient returned 3 pills and was compliant with study medicine. Subject was randomized and new medication was dispensed (kit number: [id number]). Subject Diary and dosing instructions were reviewed with the patient. Patient was reminded to stay on stable caffeine and nicotine consumption, minimize alcohol consumption if applicable, and take the study medication at the same time of day 30 minutes after evening meal with a full glass of water. Visit 8 was scheduled for [id number] at 08:00h.

## 2014-05-26 ENCOUNTER — Ambulatory Visit (INDEPENDENT_AMBULATORY_CARE_PROVIDER_SITE_OTHER): Payer: Self-pay | Admitting: Neurology

## 2014-05-26 VITALS — BP 128/88 | HR 76 | Temp 98.6°F | Resp 12

## 2014-05-26 DIAGNOSIS — R269 Unspecified abnormalities of gait and mobility: Secondary | ICD-10-CM

## 2014-05-26 DIAGNOSIS — G35 Multiple sclerosis: Secondary | ICD-10-CM

## 2014-05-26 NOTE — Progress Notes (Signed)
Alexis Cervantes (DOB: 29-May-1960) was seen today for Visit 8 for the [facility] [id number] Research Trial: A Placebo-Controlled Randomized withdrawal evaluation of the efficacy and safety of Baclofen ER capsules (GRS) in subjects with Spasticity due to Multiple Sclerosis. Patient was accompanied by her daughter. Since the last visit, patient stated that there were not new adverse events or changes in her medications. Vitals signs were found to be non-clinically-significant. Clinical Global Impression of Change (CGIC) and Subject's Global Impression of Severity (SGIS) were found to be minimally improved and moderate spasticity, respectively. CGIC and SGIS were performed by Dr. Levert Feinstein. C-SSRS was performed by [facility] Karie Chimera; C-SSRS did not exhibit significant findings. Patient was compliant with study medicine. New Subject Diary pages were dispensed, and diary and dosing instructions were reviewed with the patient.   Patient expressed interest in the [facility] [id number] Research Trial: A Clinical Evaluation of the Safety of Baclofen ER Capsules (GRS) when administered once daily to subjects with spasticity due to Multiple Sclerosis (MS): An Open Label, Long Term, Safety trial. Therefore, patient was pre-enrolled. A hematology panel and urinalysis culture were collected.  Patient was reminded to stay on stable caffeine and nicotine consumption, minimize alcohol consumption if applicable, and take the study medication at the same time of day 30 minutes after the evening meal with a full glass of water. Visit 9 was scheduled for [id number] at 11:00h.

## 2014-05-26 NOTE — Progress Notes (Signed)
Alexis Cervantes is a 54 year old right-handed black female with a history of relapsing remitting multiple sclerosis, following up of [facility] Trial.  She was diagnosed with multiple sclerosis since Feb 2006.  She presented with right jaw pain, memory loss, fatigue, blurry vision, but no gait difficulty, when she was first diagnosed in 2006. She was treated with Betaseron from 2006 till 2013, then she was treated with Gilenya (from Nov 2013 till June 2015)  She began to have gradually worsening gait problems and difficulty with balance since 2012. She has numbness of the hands and feet,  have some blurred vision, and this improves when she closes her right eye.  She denies problems controlling the bowels or the bladder.   She was in enrolled in [place] trial, was randomized to Gilenya since November 2013. From 2013 till the end of 2014, she is very stable clinical wise, continue have baseline mild gait difficulty, ambulate with cane as needed.     MRI brain: multiple supratentorial (periventricular and subcortical) and infratentorial (cerebellar and pontine) chronic demyelinating plaques. Some of these are hypointense on T1 views. No abnormal lesions are seen on post contrast views.   She had repeat MRI of the brain November 2013, later also in March 2014, July 2014,  hile taking Gilenya, there was no significant change clinical wise or  Image wise.  MRI cervical most recent in 2006 showed  multiple chronic demyelinating plaques at C2, C2-3, C3, C5, C7.  No acute plaques are seen. At C4-5, C5-6, C6-7: Spondylosis and disc bulging with severe biforaminal stenosis.  At C3-4: Disc bulging there is moderate biforaminal stenosis.  She complains of left hip for a while, now no longer hurting her.  UPDATE Feb 2015,   In Jan 26th 2015, she woke up in the middle of the night using bathroom,   noticed dense numbness at bilateral lower extremities, her legs give out underneath her, she was not able to  ambulate.  She was  admitted to the hospital,  treated with iv steroid x 3 days, followed by po tapering, now walking with walker, she is also receiving physical therapy there is mild improvement of her walking, but she complains of bilateral lower extremity paresthesia, weakness, right more than left, no bowel bladder incontinence,     UPDATE June 5th 2015: She has to use walker, has no significant pain, she has no bowel bladder incontinence. JC virus antibody was 0.1 4 in February 2015, was negative.  We have reviewed  MRI scan of the brain showing multiple white matter hyperintensity in scattered throughout the brain stem, cerebellum and subcortical and periventricular white matter typical for multiple sclerosis. No enhancing lesions are noted. The presence of multiple T1 black holes and mild atrophy of corpus callosum indicates chronic disease. Overall progression of white matter changes compared with previous MRI scan dated 06/26/2004.  MRI scan of cervical spine showing mild spondylitic changes but without significant compression. Spinal cord white matter ill-defined hyperintensities throughout represent remote age demyelinating plaques. No enhancing lesions are noted. Slight progression of spinal cord white matter changes compared with MRI scan dated 07/19/2004  She is now on Tysarbri infusion since Sept 21 2015, Jc virus antibody negative 0.08 in 02/25/2014  UPDATE Sep 8th 2015: She has been taking gabapentin as needed basis, last use was June 2015, there was no worsening of her functional status since Gilenya was stopped since June 20 third, 2015, she has been taking baclofen 10 mg 2 tablets 3 times day  since June fifth 2015 which did help her bilateral lower extremity spasticity.  Repeat laboratory evaluation in August showed normal CBC, WBC was 3.5.  UPDATE Oct 16th 2015: She came in for [facility] trial, she is on stable dose of baclofen ER 30 mg every night, tolerating it well, less  spasticity at evening time, but next morning, especially during the day, she noticed increased spasticity, muscle spasm achy pain  Today she was noticed to have elevated blood pressure in the range of 170/100, add on lisinopril  qday  UPDATE Oct 26th 2015: She came evening as unscheduled visit, she reported that higher dose of baclofen ER 40 mg every day works better than 30 mg, she noticed less leg stiffness, no significant side effect,  She was enrolled in [place] 9-21 since Sept 23 2015, she was taking baclofen  2 tabs tid prior to enrollment (since June 2015), was given Baclofen ER  since Sept 23 2015, she tolerated the medication very well,  Baclofen ER dosage was increased from 30 to 40 mg each day in February 12 2014, she tolerating it well,  Oct 29th 2015: Keep Baclofen Er  qhs, bp is 160/80s. Doing well  Nov 26th 2015: She complains of cough with lisinopril 10 mg twice a day, no switched to norvasc 10 mg daily, doing very well  UPDATE Dec 22nd 2015: She is here according to protocol, tolerating baclofen ER 50 mg every night, without significant muscle spasm, she is now taking to Norvasc 10 mg every day, blood pressure is under better control now, no longer has cough  UPDATE May 20 2014: This is visit 7, she has been compliant with her medications, and randomization today  Update May 26 2014, She is doing very well, bilateral lower extremity stiffness, but no muscle spasm, slow worsening gait difficulty, she is considering further treatment for her lower extremities stiffness, such as EMG guided Botox injection, Also complains of intermittent right facial pain, is taking gabapentin 300 mg 3 times a day  Physical Exam  General:  The patient is moderately obese. Head: normal cephalic Ears, Nose and Throat: normal Neck: Neck is supple, no carotid bruits noted. Respiratory: Respiratory examination is clear. Cardiovascular: Cardiovascular examination reveals a  regular rate and rhythm Musculoskeletal: no deformity Skin: Extremities are without significant edema. Trunk: normal  Neurologic Exam  Mental Status: awake, alert, cooperative on examination Cranial Nerves: Facial symmetry is present. fundi sharp bilaterally. extrocular movement was full, visual field is full on confrontational test, facial sensation and strength were normal. Hearing intact bilaterally, head turning and shoulder shrug are normal bilaterally.  Speech is well enunciated, no aphasia or dysarthria is noted.  Tongue protrusion into cheek strength were normal. Motor:  moderate bilateral lower extremity spasticity, mild bilateral hip flexion weakness (R/L), hip flexion 4/4-, knee flexion4/4- knee extension 4/4- ankle dorsi flexion 4/3,  upper extremity motor strength is normal with mild spasticity.. Sensory: decreased pinprick, soft touch, vibratory sensation at right hand, lower extremities, there is a stocking pattern pinprick sensory deficit up to the knees bilaterally, decreased vibratory sensation to midshin, impaired toe proprioception. Coordination:  finger-to-nose and heel-to-shin were normal bilaterally. Gait and Station: wide based, stiff, unsteady gait, cautious, spastice gait, relies on her walker Reflexes: hyperreflexia of bilateral upper and lower extremity muscles, nonsustained left ankle clonus, plantar responses were flexor at right, extensor at left side.  Assessment and plan: 54 years old [place] American female, with history of relapsing remitting multiple sclerosis, was tolerating Gilenya well, but  had recent flareup in May 25 2013, presenting with bilateral lower extremity weakness, numbness, increased gait difficulty, previous MRI showed extensive brain, and also cervical lesions, most recent flareup most likely represent a cervical event, repeat MRI of the brain and cervical spine did show progression of the disease in comparison to 2006, her JC virus antibody was  negative, with titer of 0.14, after extensive discussion with patient,   We have started Tysarbri infusion since Sept 21st 2015. Jc virus antibody was negative, she is overall doing well, agree roll over to 11-4, may consider EMG guided Botox injection for her bilateral lower extremity spasticity later  continue follow-up according protocol,   She is now on Norvasc 10mg  qday, BP is under good control now.  Levert Feinstein, M.D. Ph.D.  [facility] [address] [place], [place] [address] Phone: [phone] Fax:      [phone]

## 2014-06-03 ENCOUNTER — Ambulatory Visit (INDEPENDENT_AMBULATORY_CARE_PROVIDER_SITE_OTHER): Payer: Self-pay | Admitting: Neurology

## 2014-06-03 VITALS — BP 136/88 | HR 68 | Temp 98.5°F | Resp 16

## 2014-06-03 DIAGNOSIS — G35 Multiple sclerosis: Secondary | ICD-10-CM

## 2014-06-03 DIAGNOSIS — G500 Trigeminal neuralgia: Secondary | ICD-10-CM

## 2014-06-03 MED ORDER — OXCARBAZEPINE 150 MG PO TABS: 150.0000 mg | ORAL_TABLET | Freq: Two times a day (BID) | ORAL | Status: DC

## 2014-06-03 NOTE — Progress Notes (Signed)
Alexis Cervantes is a 54 year old right-handed black female with a history of relapsing remitting multiple sclerosis, following up of [facility] Trial.  She was diagnosed with multiple sclerosis since Feb 2006.  She presented with right jaw pain, memory loss, fatigue, blurry vision, but no gait difficulty, when she was first diagnosed in 2006. She was treated with Betaseron from 2006 till 2013, then she was treated with Gilenya (from Nov 2013 till June 2015)  She began to have gradually worsening gait problems and difficulty with balance since 2012. She has numbness of the hands and feet,  have some blurred vision, and this improves when she closes her right eye.  She denies problems controlling the bowels or the bladder.   She was in enrolled in [place] trial, was randomized to Gilenya since November 2013. From 2013 till the end of 2014, she is very stable clinical wise, continue have baseline mild gait difficulty, ambulate with cane as needed.     MRI brain: multiple supratentorial (periventricular and subcortical) and infratentorial (cerebellar and pontine) chronic demyelinating plaques. Some of these are hypointense on T1 views. No abnormal lesions are seen on post contrast views.   She had repeat MRI of the brain November 2013, later also in March 2014, July 2014,  hile taking Gilenya, there was no significant change clinical wise or  Image wise.  MRI cervical most recent in 2006 showed  multiple chronic demyelinating plaques at C2, C2-3, C3, C5, C7.  No acute plaques are seen. At C4-5, C5-6, C6-7: Spondylosis and disc bulging with severe biforaminal stenosis.  At C3-4: Disc bulging there is moderate biforaminal stenosis.  She complains of left hip for a while, now no longer hurting her.  UPDATE Feb 2015,   In Jan 26th 2015, she woke up in the middle of the night using bathroom,   noticed dense numbness at bilateral lower extremities, her legs give out underneath her, she was not able to  ambulate.  She was  admitted to the hospital,  treated with iv steroid x 3 days, followed by po tapering, now walking with walker, she is also receiving physical therapy there is mild improvement of her walking, but she complains of bilateral lower extremity paresthesia, weakness, right more than left, no bowel bladder incontinence,     UPDATE June 5th 2015: She has to use walker, has no significant pain, she has no bowel bladder incontinence. JC virus antibody was 0.1 4 in February 2015, was negative.  We have reviewed  MRI scan of the brain showing multiple white matter hyperintensity in scattered throughout the brain stem, cerebellum and subcortical and periventricular white matter typical for multiple sclerosis. No enhancing lesions are noted. The presence of multiple T1 black holes and mild atrophy of corpus callosum indicates chronic disease. Overall progression of white matter changes compared with previous MRI scan dated 06/26/2004.  MRI scan of cervical spine showing mild spondylitic changes but without significant compression. Spinal cord white matter ill-defined hyperintensities throughout represent remote age demyelinating plaques. No enhancing lesions are noted. Slight progression of spinal cord white matter changes compared with MRI scan dated 07/19/2004  She is now on Tysarbri infusion since Sept 21 2015, Jc virus antibody negative 0.08 in 02/25/2014  UPDATE Sep 8th 2015: She has been taking gabapentin as needed basis, last use was June 2015, there was no worsening of her functional status since Gilenya was stopped since June 20 third, 2015, she has been taking baclofen 10 mg 2 tablets 3 times day  since June fifth 2015 which did help her bilateral lower extremity spasticity.  Repeat laboratory evaluation in August showed normal CBC, WBC was 3.5.  UPDATE Oct 16th 2015: She came in for [facility] trial, she is on stable dose of baclofen ER 30 mg every night, tolerating it well, less  spasticity at evening time, but next morning, especially during the day, she noticed increased spasticity, muscle spasm achy pain  Today she was noticed to have elevated blood pressure in the range of 170/100, add on lisinopril  qday  UPDATE Oct 26th 2015: She came evening as unscheduled visit, she reported that higher dose of baclofen ER 40 mg every day works better than 30 mg, she noticed less leg stiffness, no significant side effect,  She was enrolled in [place] 9-21 since Sept 23 2015, she was taking baclofen  2 tabs tid prior to enrollment (since June 2015), was given Baclofen ER  since Sept 23 2015, she tolerated the medication very well,  Baclofen ER dosage was increased from 30 to 40 mg each day in February 12 2014, she tolerating it well,  Oct 29th 2015: Keep Baclofen Er  qhs, bp is 160/80s. Doing well  Nov 26th 2015: She complains of cough with lisinopril 10 mg twice a day, no switched to norvasc 10 mg daily, doing very well  UPDATE Dec 22nd 2015: She is here according to protocol, tolerating baclofen ER 50 mg every night, without significant muscle spasm, she is now taking to Norvasc 10 mg every day, blood pressure is under better control now, no longer has cough  UPDATE May 20 2014: This is visit 7, she has been compliant with her medications, and randomization today  Update May 26 2014, She is doing very well, bilateral lower extremity stiffness, but no muscle spasm, slow worsening gait difficulty, she is considering further treatment for her lower extremities stiffness, such as EMG guided Botox injection, Also complains of intermittent right facial pain, is taking gabapentin 300 mg 3 times a day  UPDATE Feb 4th 2016: There was no significant change in her spasticity, she is taking gabapentin 300 mg 3 times a day, for her right facial pain, continue have intermittent moderate right cheek area shooting pain, consistent with right trigeminal  neuralgia,    Physical Exam  General:  The patient is moderately obese. Head: normal cephalic Ears, Nose and Throat: normal Neck: Neck is supple, no carotid bruits noted. Respiratory: Respiratory examination is clear. Cardiovascular: Cardiovascular examination reveals a regular rate and rhythm Musculoskeletal: no deformity Skin: Extremities are without significant edema. Trunk: normal  Neurologic Exam  Mental Status: awake, alert, cooperative on examination Cranial Nerves: Facial symmetry is present. fundi sharp bilaterally. extrocular movement was full, visual field is full on confrontational test, facial sensation and strength were normal. Hearing intact bilaterally, head turning and shoulder shrug are normal bilaterally.  Speech is well enunciated, no aphasia or dysarthria is noted.  Tongue protrusion into cheek strength were normal. Motor:  moderate bilateral lower extremity spasticity, mild bilateral hip flexion weakness (R/L), hip flexion 4/4-, knee flexion4/4- knee extension 4/4- ankle dorsi flexion 4/3,  upper extremity motor strength is normal with mild spasticity.. Sensory: decreased pinprick, soft touch, vibratory sensation at right hand, lower extremities, there is a stocking pattern pinprick sensory deficit up to the knees bilaterally, decreased vibratory sensation to midshin, impaired toe proprioception. Coordination:  finger-to-nose and heel-to-shin were normal bilaterally. Gait and Station: wide based, stiff, unsteady gait, cautious, spastice gait, relies on her walker Reflexes:  hyperreflexia of bilateral upper and lower extremity muscles, nonsustained left ankle clonus, plantar responses were flexor at right, extensor at left side.  Assessment and plan: 54 years old [place] American female, with history of relapsing remitting multiple sclerosis, was tolerating Gilenya well, but had recent flareup in May 25 2013, presenting with bilateral lower extremity weakness,  numbness, increased gait difficulty, previous MRI showed extensive brain, and also cervical lesions, most recent flareup most likely represent a cervical event, repeat MRI of the brain and cervical spine did show progression of the disease in comparison to 2006, her JC virus antibody was negative, with titer of 0.14, after extensive discussion with patient,   We have started Tysarbri infusion since Sept 21st 2015. Jc virus antibody was negative, she is overall doing well, agree roll over to 11-4, may consider EMG guided Botox injection for her bilateral lower extremity spasticity later  continue follow-up according protocol,   She is now on Norvasc  qday, BP is under good control now. 130/80s, Right trigeminal neuralgia, failed to improve with gabapentin 300 mg 3 times a day, I will add on Trileptal 150 mg twice a day   Alexis Cervantes, M.D. Ph.D.  [facility] [address] [place], [place] [address] Phone: [phone] Fax:      [phone]

## 2014-06-03 NOTE — Progress Notes (Signed)
Alexis Cervantes (DOB: 03/28/1961) was seen today for Visit 9 for the [facility] [id number] Research Trial: A Placebo-Controlled Randomized withdrawal evaluation of the efficacy and safety of Baclofen ER capsules (GRS) in subjects with Spasticity due to Multiple Sclerosis. Patient was accompanied by her daughter.   Since the last visit, patient stated that there were not new changes in her medications. However, patient expressed a moderate right facial pain. Vitals signs were found to be non-clinically-significant. Clinical Global Impression of Change (CGIC) and Subject's Global Impression of Severity (SGIS) were found to be "no change" and "moderate spasticity," respectively. CGIC and SGIS were performed by Dr. Levert Feinstein. C-SSRS was performed by [facility] Chimera; C-SSRS did not exhibit significant findings. Patient was compliant with study medicine. New Subject Diary pages were dispensed, and diary and dosing instructions were reviewed with the patient.   To treat the right facial pain, Oxcarbazepine (Trileptal) 150 mg b.i.d. was prescribed by Dr. Terrace Arabia.  Patient was reminded to stay on stable caffeine and nicotine consumption, minimize alcohol consumption if applicable, and take the study medication at the same time of day 30 minutes after the evening meal with a full glass of water. Visit 10 was scheduled for [id number] at 09:00h.

## 2014-06-04 NOTE — Progress Notes (Signed)
I have reviewed and agreed above plan. 

## 2014-06-09 ENCOUNTER — Ambulatory Visit (INDEPENDENT_AMBULATORY_CARE_PROVIDER_SITE_OTHER): Payer: Self-pay | Admitting: Neurology

## 2014-06-09 VITALS — BP 121/76 | HR 78 | Temp 97.8°F | Resp 12

## 2014-06-09 DIAGNOSIS — G35 Multiple sclerosis: Secondary | ICD-10-CM

## 2014-06-09 DIAGNOSIS — G500 Trigeminal neuralgia: Secondary | ICD-10-CM

## 2014-06-09 DIAGNOSIS — R269 Unspecified abnormalities of gait and mobility: Secondary | ICD-10-CM

## 2014-06-09 DIAGNOSIS — G35D Multiple sclerosis, unspecified: Secondary | ICD-10-CM

## 2014-06-09 MED ORDER — GABAPENTIN 300 MG PO CAPS: 600.0000 mg | ORAL_CAPSULE | Freq: Three times a day (TID) | ORAL | Status: DC

## 2014-06-09 NOTE — Progress Notes (Signed)
Alexis Cervantes is a 54 year old right-handed black female with a history of relapsing remitting multiple sclerosis, following up of [facility] Trial.  She was diagnosed with multiple sclerosis since Feb 2006.  She presented with right jaw pain, memory loss, fatigue, blurry vision, but no gait difficulty, when she was first diagnosed in 2006. She was treated with Betaseron from 2006 till 2013, then she was treated with Gilenya (from Nov 2013 till June 2015)  She began to have gradually worsening gait problems and difficulty with balance since 2012. She has numbness of the hands and feet,  have some blurred vision, and this improves when she closes her right eye.  She denies problems controlling the bowels or the bladder.   She was in enrolled in [place] trial, was randomized to Gilenya since November 2013. From 2013 till the end of 2014, she is very stable clinical wise, continue have baseline mild gait difficulty, ambulate with cane as needed.     MRI brain: multiple supratentorial (periventricular and subcortical) and infratentorial (cerebellar and pontine) chronic demyelinating plaques. Some of these are hypointense on T1 views. No abnormal lesions are seen on post contrast views.   She had repeat MRI of the brain November 2013, later also in March 2014, July 2014,  hile taking Gilenya, there was no significant change clinical wise or  Image wise.  MRI cervical most recent in 2006 showed  multiple chronic demyelinating plaques at C2, C2-3, C3, C5, C7.  No acute plaques are seen. At C4-5, C5-6, C6-7: Spondylosis and disc bulging with severe biforaminal stenosis.  At C3-4: Disc bulging there is moderate biforaminal stenosis.  She complains of left hip for a while, now no longer hurting her.  UPDATE Feb 2015,   In Jan 26th 2015, she woke up in the middle of the night using bathroom,   noticed dense numbness at bilateral lower extremities, her legs give out underneath her, she was not able to  ambulate.  She was  admitted to the hospital,  treated with iv steroid x 3 days, followed by po tapering, now walking with walker, she is also receiving physical therapy there is mild improvement of her walking, but she complains of bilateral lower extremity paresthesia, weakness, right more than left, no bowel bladder incontinence,     UPDATE June 5th 2015: She has to use walker, has no significant pain, she has no bowel bladder incontinence. JC virus antibody was 0.1 4 in February 2015, was negative.  We have reviewed  MRI scan of the brain showing multiple white matter hyperintensity in scattered throughout the brain stem, cerebellum and subcortical and periventricular white matter typical for multiple sclerosis. No enhancing lesions are noted. The presence of multiple T1 black holes and mild atrophy of corpus callosum indicates chronic disease. Overall progression of white matter changes compared with previous MRI scan dated 06/26/2004.  MRI scan of cervical spine showing mild spondylitic changes but without significant compression. Spinal cord white matter ill-defined hyperintensities throughout represent remote age demyelinating plaques. No enhancing lesions are noted. Slight progression of spinal cord white matter changes compared with MRI scan dated 07/19/2004  She is now on Tysarbri infusion since Sept 21 2015, Jc virus antibody negative 0.08 in 02/25/2014  UPDATE Sep 8th 2015: She has been taking gabapentin as needed basis, last use was June 2015, there was no worsening of her functional status since Gilenya was stopped since June 20 third, 2015, she has been taking baclofen 10 mg 2 tablets 3 times day  since June fifth 2015 which did help her bilateral lower extremity spasticity.  Repeat laboratory evaluation in August showed normal CBC, WBC was 3.5.  UPDATE Oct 16th 2015: She came in for [facility] trial, she is on stable dose of baclofen ER 30 mg every night, tolerating it well, less  spasticity at evening time, but next morning, especially during the day, she noticed increased spasticity, muscle spasm achy pain  Today she was noticed to have elevated blood pressure in the range of 170/100, add on lisinopril  qday  UPDATE Oct 26th 2015: She came evening as unscheduled visit, she reported that higher dose of baclofen ER 40 mg every day works better than 30 mg, she noticed less leg stiffness, no significant side effect,  She was enrolled in [place] 9-21 since Sept 23 2015, she was taking baclofen  2 tabs tid prior to enrollment (since June 2015), was given Baclofen ER  since Sept 23 2015, she tolerated the medication very well,  Baclofen ER dosage was increased from 30 to 40 mg each day in February 12 2014, she tolerating it well,  Oct 29th 2015: Keep Baclofen Er  qhs, bp is 160/80s. Doing well  Nov 26th 2015: She complains of cough with lisinopril 10 mg twice a day, no switched to norvasc 10 mg daily, doing very well  UPDATE Dec 22nd 2015: She is here according to protocol, tolerating baclofen ER 50 mg every night, without significant muscle spasm, she is now taking to Norvasc 10 mg every day, blood pressure is under better control now, no longer has cough  UPDATE May 20 2014: This is visit 7, she has been compliant with her medications, and randomization today  Update May 26 2014, She is doing very well, bilateral lower extremity stiffness, but no muscle spasm, slow worsening gait difficulty, she is considering further treatment for her lower extremities stiffness, such as EMG guided Botox injection, Also complains of intermittent right facial pain, is taking gabapentin 300 mg 3 times a day  UPDATE Feb 4th 2016: There was no significant change in her spasticity, she is taking gabapentin 300 mg 3 times a day, for her right facial pain, continue have intermittent moderate right cheek area shooting pain, consistent with right trigeminal  neuralgia,  UPDATE Feb 10th 2016: There was significant worsening of her bilateral lower extremity stiffness, spasticity, she is now taking gabapentin  tid and trileptal  for her right trigeminal pain, but continue have intermittent recurrent right facial pain, shooting down from right temporal, right cheek, no right tooth pain,   Physical Exam  General:  The patient is moderately obese. Head: normal cephalic Ears, Nose and Throat: normal Neck: Neck is supple, no carotid bruits noted. Respiratory: Respiratory examination is clear. Cardiovascular: Cardiovascular examination reveals a regular rate and rhythm Musculoskeletal: no deformity Skin: Extremities are without significant edema. Trunk: normal  Neurologic Exam  Mental Status: awake, alert, cooperative on examination Cranial Nerves: Facial symmetry is present. fundi sharp bilaterally. extrocular movement was full, visual field is full on confrontational test, facial sensation and strength were normal. Hearing intact bilaterally, head turning and shoulder shrug are normal bilaterally.  Speech is well enunciated, no aphasia or dysarthria is noted.  Tongue protrusion into cheek strength were normal. Motor:  moderate bilateral lower extremity spasticity, mild to moderate bilateral lower extremity weakness, left worse than right, hip flexion weakness (R/L), hip flexion 4/4-, knee flexion4/4- knee extension 4/4- ankle dorsi flexion 4/3,  upper extremity motor strength is normal with mild  spasticity.. Sensory: decreased pinprick, soft touch, vibratory sensation at right hand, lower extremities, there is a stocking pattern pinprick sensory deficit up to the knees bilaterally, decreased vibratory sensation to midshin, impaired toe proprioception. Coordination:  finger-to-nose and heel-to-shin were normal bilaterally. Gait and Station: wide based, stiff, unsteady gait, cautious, spastice gait, relies on her walker Reflexes: hyperreflexia  of bilateral upper and lower extremity muscles, nonsustained left ankle clonus, plantar responses were flexor at right, extensor at left side.  Assessment and plan: 54 years old [place] American female, with history of relapsing remitting multiple sclerosis, was tolerating Gilenya well, but continues to have flareup while taking Gilenya, in May 25 2013, she had worsening bilateral lower extremity weakness, numbness, increased gait difficulty, previous MRI showed extensive brain, andcervical lesions, repeat MRI of the brain and cervical spine did show progression of the disease in comparison to 2006, her JC virus antibody was negative,   Tysarbri infusion since Sept 21st 2015. she is at [facility] trial for bilateral lower extremity spasticity, continue follow-up according protocol,  Norvasc 10mg  qday, BP is under good control now, Right trigeminal neuralgia, failed to improve with gabapentin 300 mg 3 times a day, and Trileptal 150 mg twice a day, increase gabapentin to 300 mg 6 tablets each day, continue Trileptal 150 twice a day, she complains of dizziness, drowsiness with combination therapy.   Levert Feinstein, M.D. Ph.D.  [facility] [address] [place], [place] [address] Phone: [phone] Fax:      [phone]

## 2014-06-09 NOTE — Progress Notes (Signed)
I have reviewed and agreed above plan. 

## 2014-06-10 NOTE — Addendum Note (Signed)
Addended by: Shaune Leeks D on: 06/10/2014 05:52 PM   Modules accepted: Level of Service

## 2014-06-10 NOTE — Progress Notes (Signed)
Alexis Cervantes (DOB: [birth year]) was seen today for Visit 10 for the [facility] CLR_09_21 Research Trial: A Placebo-Controlled Randomized withdrawal evaluation of the efficacy and safety of Baclofen ER capsules (GRS) in subjects with Spasticity due to Multiple Sclerosis. Patient was accompanied by her daughter.   Since the last visit, patient stated that there were not new changes in her medications. However, patient still expressed a moderate right facial pain. Vitals signs were found to be non-clinically-significant. Clinical Global Impression of Change (CGIC) and Subject's Global Impression of Severity (SGIS) were found to be "no change" and "moderate spasticity," respectively. CGIC and SGIS were performed by Dr. Levert Feinstein. C-SSRS was performed by [facility] Karie Chimera; C-SSRS did not exhibit significant findings. Patient was compliant with study medicine. Subject Diary and dosing instructions were reviewed with the patient. Ashworth Assessment was performed by Andi Hence.  To treat the right facial pain, a new prescription of Gabapentin (Neurontin) (600 mg total) by mouth t.i.d. Patient was reminded to stay on stable caffeine and nicotine consumption, minimize alcohol consumption if applicable, and take the study medication at the same time of day 30 minutes after the evening meal with a full glass of water. Visit 11 was scheduled for [id number] at 08:30h.

## 2014-06-11 NOTE — Progress Notes (Signed)
I have reviewed and agreed above plan. 

## 2014-06-15 ENCOUNTER — Telehealth: Payer: Self-pay | Admitting: Neurology

## 2014-06-15 NOTE — Telephone Encounter (Signed)
Patient called to report a severe stiffness and pain when trying to move. Patient stated that she cannot move even with her walker. Symptoms started last night.

## 2014-06-15 NOTE — Telephone Encounter (Signed)
I have called Alexis Cervantes, she could not come in today, she complains worsening bilateral lower extremity spasticity, increased gait difficulty,  She will come in February 17, 820 a.m. For evaluation

## 2014-06-16 ENCOUNTER — Ambulatory Visit (INDEPENDENT_AMBULATORY_CARE_PROVIDER_SITE_OTHER): Payer: Self-pay | Admitting: Neurology

## 2014-06-16 ENCOUNTER — Telehealth: Payer: Self-pay | Admitting: Neurology

## 2014-06-16 VITALS — BP 145/90 | HR 85 | Temp 98.4°F | Resp 16

## 2014-06-16 DIAGNOSIS — R269 Unspecified abnormalities of gait and mobility: Secondary | ICD-10-CM

## 2014-06-16 DIAGNOSIS — G35 Multiple sclerosis: Secondary | ICD-10-CM

## 2014-06-16 MED ORDER — OXYCODONE-ACETAMINOPHEN 5-325 MG PO TABS: 1.0000 | ORAL_TABLET | Freq: Three times a day (TID) | ORAL | Status: DC | PRN

## 2014-06-16 NOTE — Progress Notes (Signed)
Alexis Cervantes is a 54 year old right-handed black female with a history of relapsing remitting multiple sclerosis, following up of [facility] Trial.  She was diagnosed with multiple sclerosis since Feb 2006.  She presented with right jaw pain, memory loss, fatigue, blurry vision, but no gait difficulty then. She was treated with Betaseron from 2006 till 2013, then she was treated with Gilenya (from Nov 2013 till June 2015)  She began to have gradually worsening gait problems since 2012. She has numbness of the hands and feet,  have some blurred vision. She denies problems controlling the bowels or the bladder.   She was enrolled in Gilenya PREFER trial, was randomized to Gilenya since November 2013. She was very stable clinical wise, continue have baseline mild gait difficulty, ambulate with cane as needed.     MRI brain: multiple supratentorial (periventricular and subcortical) and infratentorial (cerebellar and pontine) chronic demyelinating plaques. Some of these are hypointense on T1 views. No abnormal lesions are seen on post contrast views.   She had repeat MRI of the brain in November 2013, later also in March 2014, July 2014,  while taking Gilenya, there was no significant change clinical or  Image wise.  MRI cervical most recent in 2006 showed  multiple chronic demyelinating plaques at C2, C2-3, C3, C5, C7.  No acute plaques are seen. At C4-5, C5-6, C6-7: Spondylosis and disc bulging with severe biforaminal stenosis.  At C3-4: Disc bulging there is moderate biforaminal stenosis.  She complains of left hip for a while, now no longer hurting her.  UPDATE Feb 2015,   In Jan 26th 2015, she woke up in the middle of the night using bathroom,   noticed dense numbness at bilateral lower extremities, her legs gave out underneath her, she was not able to ambulate.  She was  admitted to the hospital,  treated with iv steroid x 3 days, followed by po tapering, now she walkswith walker,  she complains of  bilateral lower extremity paresthesia, weakness, right more than left, no bowel bladder incontinence,     UPDATE June 5th 2015: She had persistent gait difficulty since Feb 2015, she has to use walker, has no significant pain, she has no bowel bladder incontinence.   We have reviewed  MRI scan of the brain in May 2015: showing multiple white matter hyperintensity in scattered throughout the brain stem, cerebellum and subcortical and periventricular white matter typical for multiple sclerosis. No enhancing lesions are noted. The presence of multiple T1 black holes and mild atrophy of corpus callosum indicates chronic disease. Overall progression of white matter changes compared with previous MRI scan dated 06/26/2004.  MRI scan of cervical spine showing mild spondylitic changes but without significant compression. Spinal cord white matter ill-defined hyperintensities throughout represent remote age demyelinating plaques. No enhancing lesions are noted. Slight progression of spinal cord white matter changes compared with MRI scan dated 07/19/2004  She is now on Tysarbri infusion since Sept 21 2015, JC virus antibody was negative, 0.1 4 in February 2015, Jc virus antibody negative 0.08 in 02/25/2014  UPDATE Sep 8th 2015: She has been taking gabapentin as needed basis, last use was June 2015, there was no worsening of her functional status since Gilenya was stopped since October 20, 2013, she has been taking baclofen 10 mg 2 tablets 3 times day since June fifth 2015 which did help her bilateral lower extremity spasticity.  Repeat laboratory evaluation in August showed normal CBC, WBC was 3.5.  UPDATE Oct 16th 2015: She came  in for [facility] trial, she is on stable dose of baclofen ER 30 mg every night, tolerating it well, less spasticity at evening time, but next morning, especially during the day, she noticed increased spasticity, muscle spasm achy pain  Today she was noticed to have elevated blood  pressure in the range of 170/100, add on lisinopril 10mg  qday  UPDATE Oct 26th 2015: She came evening as unscheduled visit, she reported that higher dose of baclofen ER 40 mg every day works better than 30 mg, she noticed less leg stiffness, no significant side effect,  She was enrolled in [place] 9-21 since Sept 23 2015, she was taking baclofen 10mg  2 tabs tid prior to enrollment (since June 2015), was given Baclofen ER 30mg  since Sept 23 2015, she tolerated the medication very well,  Baclofen ER dosage was increased from 30 to 40 mg each day in February 12 2014, she tolerating it well,  Oct 29th 2015: Keep Baclofen Er 50mg  qhs, bp is 160/80s. Doing well  Nov 26th 2015: She complains of cough with lisinopril 10 mg twice a day, no switched to norvasc 10 mg daily, doing very well  UPDATE Dec 22nd 2015: She is here according to protocol, tolerating baclofen ER 50 mg every night, without significant muscle spasm, she is now taking to Norvasc 10 mg every day, blood pressure is under better control now, no longer has cough  UPDATE May 20 2014: This is visit 7, she has been compliant with her medications, and randomization today  Update May 26 2014, She is doing very well, bilateral lower extremity stiffness, but no muscle spasm, slow worsening gait difficulty, she is considering further treatment for her lower extremities stiffness, such as EMG guided Botox injection, Also complains of intermittent right facial pain, is taking gabapentin 300 mg 3 times a day  UPDATE Feb 4th 2016: There was no significant change in her spasticity, she is taking gabapentin 300 mg 3 times a day, for her right facial pain, continue have intermittent moderate right cheek area shooting pain, consistent with right trigeminal neuralgia,  UPDATE Feb 10th 2016: There was significant worsening of her bilateral lower extremity stiffness, spasticity, she is now taking gabapentin 300mg  tid and trileptal 150mg  for her  right trigeminal pain, but continue have intermittent recurrent right facial pain, shooting down from right temporal, right cheek, no right tooth pain,  UPDATE Feb 17th 2016: She complains of worsening lefg stiffness, low back pain, worsneing gait difficulty, incontineince 2 weeek, urinary, incotnineice, getting osre,  Midline, no shooting pain, but she has more stiness pain in her antieror legs, achiness,  Gait id much worse. Not because of pain,    Physical Exam  General:  The patient is moderately obese. Head: normal cephalic Ears, Nose and Throat: normal Neck: Neck is supple, no carotid bruits noted. Respiratory: Respiratory examination is clear. Cardiovascular: Cardiovascular examination reveals a regular rate and rhythm Musculoskeletal: no deformity Skin: Extremities are without significant edema. Trunk: normal  Neurologic Exam  Mental Status: awake, alert, cooperative on examination Cranial Nerves: Facial symmetry is present. fundi sharp bilaterally. extrocular movement was full, visual field is full on confrontational test, facial sensation and strength were normal. Hearing intact bilaterally, head turning and shoulder shrug are normal bilaterally.  Speech is well enunciated, no aphasia or dysarthria is noted.  Tongue protrusion into cheek strength were normal. Motor:  moderate bilateral lower extremity spasticity, mild to moderate bilateral lower extremity weakness, left worse than right, hip flexion weakness (R/L), hip flexion  4/4-, knee flexion4/4- knee extension 4/4- ankle dorsi flexion 4/3,  upper extremity motor strength is normal with mild spasticity.. Sensory: decreased pinprick, soft touch, vibratory sensation at right hand, lower extremities, there is a stocking pattern pinprick sensory deficit up to the knees bilaterally, decreased vibratory sensation to midshin, impaired toe proprioception. Coordination:  finger-to-nose and heel-to-shin were normal bilaterally. Gait and  Station: wide based, stiff, unsteady gait, cautious, spastice gait, relies on her walker Reflexes: hyperreflexia of bilateral upper and lower extremity muscles, nonsustained left ankle clonus, plantar responses were flexor at right, extensor at left side.  Assessment and plan: 54 years old [place] American female, with history of relapsing remitting multiple sclerosis, was tolerating Gilenya well, but continues to have flareup while taking Gilenya, in May 25 2013, she had worsening bilateral lower extremity weakness, numbness, increased gait difficulty, previous MRI showed extensive brain, andcervical lesions, repeat MRI of the brain and cervical spine did show progression of the disease in comparison to 2006, her JC virus antibody was negative,   Tysarbri infusion since Sept 21st 2015. she is at [facility] trial for bilateral lower extremity spasticity, continue follow-up according protocol,  Norvasc  qday, BP is under good control now, Right trigeminal neuralgia, failed to improve with gabapentin 300 mg 3 times a day, and Trileptal 150 mg twice a day, increase gabapentin to 300 mg 6 tablets each day, continue Trileptal 150 twice a day, she complains of dizziness, drowsiness with combination therapy.   Levert Feinstein, M.D. Ph.D.  [facility] [address] [place], [place] [address] Phone: [phone] Fax:      [phone]

## 2014-06-16 NOTE — Progress Notes (Signed)
Alexis Cervantes (DOB: [id number]) was seen today for Visit 11 (final visit) for the [employer] CLR_09_21 Research Trial: A Placebo-Controlled Randomized withdrawal evaluation of the efficacy and safety of Baclofen ER capsules (GRS) in subjects with Spasticity due to Multiple Sclerosis and for Screening Visit for the [employer] CLR_11_04 Research Trial: A Clinical Evaluation Of The Safety of Baclofen ER Capsules (GRS) When Administered Once Daily To Subjects With Spasticity Due To Multiple Sclerosis (MS): An Open Label, Long Term, Safety Trial. Patient was accompanied by her sister.   Since the last visit, patient stated that there were not new changes in her medications. However, patient still expressed a moderate right facial pain trigeminal neuralgia and lower back pain. Vitals signs were found to be non-clinically-significant. Clinical Global Impression of Change (CGIC) and Subject's Global Impression of Severity (SGIS) were found to be "much worse" and "severe spasticity," respectively. CGIC and SGIS were performed by Dr. Marcial Pacas. C-SSRS was performed by [facility] Shea Evans; C-SSRS did not exhibit significant findings. Patient returned medicine (kit number: [id number]) and was found to be compliant. Diary pages were collected. Ashworth Assessment was performed by Jaci Standard.  Patient signed the Informed Consent Form (ICF) for the CLR_11_04, was provided with a copy of the signed ICF, and was given ample time to review the document and to ask questions. Patient then rolled over to the CLR_11_04 study. Dr. Krista Blue performed a complete physical examination and a neurological examination. A 12 lead ECG was also performed in triplicate at two minute intervals: 10:13h, 10:15h, and 10:17h. Clinical laboratory tests were also performed. Study medication (Kit Number: [id number]) was dispensed to the subject. New Subject Diary and dosing instructions were reviewed with the patient.   To treat the lower back  pain, a new prescription of Percocet 325 mg. PRN. Was prescribed. Patient was reminded to minimize alcohol consumption if applicable and take the study medication at the same time of day 30 minutes after the evening meal with a full glass of water.

## 2014-06-16 NOTE — Telephone Encounter (Signed)
Letter generated

## 2014-06-17 ENCOUNTER — Encounter (HOSPITAL_COMMUNITY)
Admission: RE | Admit: 2014-06-17 | Discharge: 2014-06-17 | Disposition: A | Discharge status: Home / Self Care | Payer: 59 | Source: Ambulatory Visit | Attending: Neurology | Admitting: Neurology

## 2014-06-17 ENCOUNTER — Telehealth: Payer: Self-pay | Admitting: Neurology

## 2014-06-17 ENCOUNTER — Encounter (HOSPITAL_COMMUNITY): Payer: Self-pay

## 2014-06-17 ENCOUNTER — Encounter: Payer: 59 | Admitting: Neurology

## 2014-06-17 ENCOUNTER — Encounter: Payer: Self-pay | Admitting: Neurology

## 2014-06-17 VITALS — BP 146/75 | HR 96 | Temp 97.7°F | Resp 16 | Ht 66.0 in | Wt 191.0 lb

## 2014-06-17 DIAGNOSIS — G35 Multiple sclerosis: Secondary | ICD-10-CM | POA: Diagnosis not present

## 2014-06-17 MED ORDER — LORATADINE 10 MG PO TABS
10.0000 mg | ORAL_TABLET | ORAL | Status: AC
  Administered 2014-06-17: 10 mg via ORAL
  Filled 2014-06-17: qty 1

## 2014-06-17 MED ORDER — NATALIZUMAB 300 MG/15ML IV CONC
300.0000 mg | INTRAVENOUS | Status: AC
  Administered 2014-06-17: 300 mg via INTRAVENOUS
  Filled 2014-06-17: qty 15

## 2014-06-17 MED ORDER — SODIUM CHLORIDE 0.9 % IV SOLN
INTRAVENOUS | Status: AC
  Administered 2014-06-17: 250 mL via INTRAVENOUS

## 2014-06-17 MED ORDER — ACETAMINOPHEN 500 MG PO TABS
1000.0000 mg | ORAL_TABLET | ORAL | Status: AC
  Administered 2014-06-17: 1000 mg via ORAL
  Filled 2014-06-17: qty 2

## 2014-06-17 NOTE — Telephone Encounter (Signed)
Alexis Cervantes:  Please call patient makes sure that she is fine,

## 2014-06-17 NOTE — Telephone Encounter (Signed)
Left message for patient to call back  

## 2014-06-17 NOTE — Progress Notes (Signed)
Pt was incontinent of urine and taken to BR to change into fresh clothing. VSS and uneventful infusion of TYSABRI. Pt voices no c/o. Pt stayed 45 minutes of the suggested 1 hour post infusion observation time. Pt and family eager to go home for fresh clothes. Pt will contact MD for any questions or concerns.

## 2014-06-17 NOTE — Telephone Encounter (Signed)
Spoke to Graceson - said she is feeling fine - said it was not a hard fall at all.

## 2014-06-17 NOTE — Telephone Encounter (Signed)
-----   Message from Sim Boast, RN sent at 06/17/2014 11:58 AM EST ----- Regarding: information Dr Terrace Arabia , Please read my notes today about this patient today. She fell at home prior to her arrival for her TYSABRI today. She was not injured and we did infuse her as ordered. Thanks, Lamont Snowball RN [facility] Short Stay [phone]

## 2014-06-17 NOTE — Progress Notes (Addendum)
Pt arrived today via w/c accompanied by sister. States she had an office visit with Dr Terrace Arabia yesterday to discuss the weakness of her legs. Today she states that she"fell at she was leaving her house using her walker" "I was unable to get up with the help of my sister and we had to call 911 to help me get up" Pt states she was evaluated by EMS and she and EMS determined there was no need to go to ED for further evaluation so she came on in for her appointment. She was assisted into car by EMS and sister brought her in for her appointment. Pt states she is not sore and is not having pain related to this.

## 2014-06-22 ENCOUNTER — Telehealth: Payer: Self-pay | Admitting: Neurology

## 2014-06-22 NOTE — Telephone Encounter (Signed)
The patient's daughter called requesting the study coordinator's email to get a letter signed by Dr. Terrace Arabia.

## 2014-06-23 NOTE — Progress Notes (Signed)
I have reviewed and agreed above plan. 

## 2014-07-05 ENCOUNTER — Telehealth: Payer: Self-pay | Admitting: Neurology

## 2014-07-05 NOTE — Telephone Encounter (Signed)
Patient is calling in regard to her physical therapy plan of action.  She thought someone would be coming to her home but has not heard from anyone.  Please advise.

## 2014-07-05 NOTE — Telephone Encounter (Signed)
Spoke to Alexis Cervantes - she is aware that we have asked [facility] to call her to set up appts.

## 2014-07-06 ENCOUNTER — Telehealth: Payer: Self-pay | Admitting: *Deleted

## 2014-07-06 NOTE — Addendum Note (Signed)
Addended by: Levert Feinstein on: 07/06/2014 04:48 PM   Modules accepted: Orders

## 2014-07-06 NOTE — Telephone Encounter (Addendum)
Patient can not afford to have home physical therapy. The patient would like to cancel the order.   Cancelled.  Levert Feinstein, M.D. Ph.D.  [facility] [address] [place], [place] [address] Phone: [phone] Fax:      [phone]

## 2014-07-08 ENCOUNTER — Other Ambulatory Visit: Payer: Self-pay | Admitting: Neurology

## 2014-07-08 DIAGNOSIS — G35 Multiple sclerosis: Secondary | ICD-10-CM

## 2014-07-15 ENCOUNTER — Encounter (HOSPITAL_COMMUNITY)
Admission: RE | Admit: 2014-07-15 | Discharge: 2014-07-15 | Disposition: A | Discharge status: Home / Self Care | Payer: 59 | Source: Ambulatory Visit | Attending: Neurology | Admitting: Neurology

## 2014-07-15 ENCOUNTER — Ambulatory Visit: Payer: 59

## 2014-07-15 ENCOUNTER — Encounter (HOSPITAL_COMMUNITY): Payer: Self-pay

## 2014-07-15 ENCOUNTER — Telehealth: Payer: Self-pay | Admitting: Neurology

## 2014-07-15 VITALS — BP 124/70 | HR 61 | Temp 98.4°F | Resp 16 | Ht 66.0 in | Wt 191.0 lb

## 2014-07-15 VITALS — BP 112/72 | HR 70 | Temp 97.9°F | Resp 12

## 2014-07-15 DIAGNOSIS — G35 Multiple sclerosis: Secondary | ICD-10-CM

## 2014-07-15 DIAGNOSIS — Z006 Encounter for examination for normal comparison and control in clinical research program: Secondary | ICD-10-CM

## 2014-07-15 MED ORDER — SODIUM CHLORIDE 0.9 % IV SOLN
INTRAVENOUS | Status: DC
  Administered 2014-07-15: 12:00:00 via INTRAVENOUS

## 2014-07-15 MED ORDER — ACETAMINOPHEN 500 MG PO TABS
1000.0000 mg | ORAL_TABLET | ORAL | Status: DC
  Administered 2014-07-15: 1000 mg via ORAL
  Filled 2014-07-15: qty 2

## 2014-07-15 MED ORDER — SODIUM CHLORIDE 0.9 % IV SOLN
300.0000 mg | INTRAVENOUS | Status: DC
  Administered 2014-07-15: 300 mg via INTRAVENOUS
  Filled 2014-07-15: qty 15

## 2014-07-15 MED ORDER — LORATADINE 10 MG PO TABS
10.0000 mg | ORAL_TABLET | ORAL | Status: DC
  Administered 2014-07-15: 10 mg via ORAL
  Filled 2014-07-15: qty 1

## 2014-07-15 NOTE — Progress Notes (Signed)
Amandamarie Feggins (DOB: [id number]) was seen today Visit 3 for the [facility] CLR_11_04 Research Trial: A Clinical Evaluation Of The Safety of Baclofen ER Capsules (GRS) When Administered Once Daily To Subjects With Spasticity Due To Multiple Sclerosis (MS): An Open Label, Long Term, Safety Trial. Patient was accompanied by her daughter.  Since the last visit, patient stated that there were not new adverse events or changes in her medications. Vitals signs were found to be non-clinically-significant. C-SSRS was performed by [facility] Shea Evans; C-SSRS did not exhibit significant findings. Patient returned medicine (kit number: [id number]) and was found to be compliant. Diary pages were collected.  A 12 lead ECG was also performed in triplicate at two minute intervals: 14:12h, 14:14h, and 14:16h. Study medication was dispensed to the subject. New Subject Diary and dosing instructions were reviewed with the patient.   Patient was reminded to minimize alcohol consumption if applicable and take the study medication at the same time of day 30 minutes after the evening meal with a full glass of water. Visit 4 was scheduled for Wednesday, [id number].

## 2014-07-15 NOTE — Discharge Instructions (Signed)
Natalizumab injection °What is this medicine? °NATALIZUMAB (na ta LIZ you mab) is used to treat relapsing multiple sclerosis. This drug is not a cure. It is also used to treat Crohn's disease. °This medicine may be used for other purposes; ask your health care provider or pharmacist if you have questions. °COMMON BRAND NAME(S): Tysabri °What should I tell my health care provider before I take this medicine? °They need to know if you have any of these conditions: °-immune system problems °-progressive multifocal leukoencephalopathy (PML) °-an unusual or allergic reaction to natalizumab, other medicines, foods, dyes, or preservatives °-pregnant or trying to get pregnant °-breast-feeding °How should I use this medicine? °This medicine is for infusion into a vein. It is given by a health care professional in a hospital or clinic setting. °A special MedGuide will be given to you by the pharmacist with each prescription and refill. Be sure to read this information carefully each time. °Talk to your pediatrician regarding the use of this medicine in children. This medicine is not approved for use in children. °Overdosage: If you think you have taken too much of this medicine contact a poison control center or emergency room at once. °NOTE: This medicine is only for you. Do not share this medicine with others. °What if I miss a dose? °It is important not to miss your dose. Call your doctor or health care professional if you are unable to keep an appointment. °What may interact with this medicine? °-azathioprine °-cyclosporine °-interferon °-6-mercaptopurine °-methotrexate °-steroid medicines like prednisone or cortisone °-TNF-alpha inhibitors like adalimumab, etanercept, and infliximab °-vaccines °This list may not describe all possible interactions. Give your health care provider a list of all the medicines, herbs, non-prescription drugs, or dietary supplements you use. Also tell them if you smoke, drink alcohol, or use  illegal drugs. Some items may interact with your medicine. °What should I watch for while using this medicine? °Your condition will be monitored carefully while you are receiving this medicine. Visit your doctor for regular check ups. Tell your doctor or healthcare professional if your symptoms do not start to get better or if they get worse. °Stay away from people who are sick. Call your doctor or health care professional for advice if you get a fever, chills or sore throat, or other symptoms of a cold or flu. Do not treat yourself. °In some patients, this medicine may cause a serious brain infection that may cause death. If you have any problems seeing, thinking, speaking, walking, or standing, tell your doctor right away. If you cannot reach your doctor, get urgent medical care. °What side effects may I notice from receiving this medicine? °Side effects that you should report to your doctor or health care professional as soon as possible: °-allergic reactions like skin rash, itching or hives, swelling of the face, lips, or tongue °-breathing problems °-changes in vision °-chest pain °-dark urine °-depression, feelings of sadness °-dizziness °-general ill feeling or flu-like symptoms °-irregular, missed, or painful menstrual periods °-light-colored stools °-loss of appetite, nausea °-muscle weakness °-problems with balance, talking, or walking °-right upper belly pain °-unusually weak or tired °-yellowing of the eyes or skin °Side effects that usually do not require medical attention (report to your doctor or health care professional if they continue or are bothersome): °-aches, pains °-headache °-stomach upset °-tiredness °This list may not describe all possible side effects. Call your doctor for medical advice about side effects. You may report side effects to FDA at 1-800-FDA-1088. °Where should I keep   my medicine? °This drug is given in a hospital or clinic and will not be stored at home. °NOTE: This sheet is  a summary. It may not cover all possible information. If you have questions about this medicine, talk to your doctor, pharmacist, or health care provider. °© 2015, [employer]vier/[employer] Standard. (2008-06-05 13:33:21) °Multiple Sclerosis °Multiple sclerosis (MS) is a disease of the central nervous system. It leads to the loss of the insulating covering of the nerves (myelin sheath) of your brain. When this happens, brain signals do not get sent properly or may not get sent at all. The age of onset of MS varies.  °CAUSES °The cause of MS is unknown. However, it is more common in the northern [place] than in the southern [place]. °RISK FACTORS °There is a higher number of women with MS than men. MS is not an illness that is passed down to you from your family members (inherited). However, your risk of MS is higher if you have a relative with MS. °SIGNS AND SYMPTOMS  °The symptoms of MS occur in episodes or attacks. These attacks may last weeks to months. There may be long periods of almost no symptoms between attacks. The symptoms of MS vary. This is because of the many different ways it affects the central nervous system. The main symptoms of MS include: °· Vision problems and eye pain. °· Numbness. °· Weakness. °· Inability to move your arms, hands, feet, or legs (paralysis). °· Balance problems. °· Tremors. °DIAGNOSIS  °Your health care provider can diagnose MS with the help of imaging exams and lab tests. These may include specialized X-ray exams and spinal fluid tests. The best imaging exam to confirm a diagnosis of MS is an MRI. °TREATMENT  °There is no known cure for MS, but there are medicines that can decrease the number and frequency of attacks. Steroids are often used for short-term relief. Physical and occupational therapy may also help. There are also many new alternative or complementary treatments available to help control the symptoms of MS. Ask your health care provider if any of these other  options are right for you. °HOME CARE INSTRUCTIONS  °· Take medicines as directed by your health care provider. °· Exercise as directed by your health care provider. °SEEK MEDICAL CARE IF: °You begin to feel depressed. °SEEK IMMEDIATE MEDICAL CARE IF: °· You develop paralysis. °· You have problems with bladder, bowel, or sexual function. °· You develop mental changes, such as forgetfulness or mood swings. °· You have a period of uncontrolled movements (seizure). °Document Released: 04/13/2000 Document Revised: 04/21/2013 Document Reviewed: 12/22/2012 °ExitCare® Patient Information ©2015 [employer]. This information is not intended to replace advice given to you by your health care provider. Make sure you discuss any questions you have with your health care provider. ° ° ° °

## 2014-07-15 NOTE — Progress Notes (Signed)
Post-infusion Tysabri, pt. Stated" I have another appointment, pt. Stayed 25 minutes post-infusion", pt. To follow-up with doctor with any problems.

## 2014-07-15 NOTE — Telephone Encounter (Signed)
I spoke to the patient about the office closing due to a problem with the water supply. Appointment was rescheduled for Wednesday, [id number].

## 2014-07-16 NOTE — Progress Notes (Signed)
I have reviewed and agreed above plan. 

## 2014-08-02 ENCOUNTER — Other Ambulatory Visit: Payer: Self-pay | Admitting: Neurology

## 2014-08-02 NOTE — Telephone Encounter (Signed)
Patient is calling to get a written Rx for Oxycodone-acetaminophen 5-325 mg.  Please call.

## 2014-08-03 MED ORDER — OXYCODONE-ACETAMINOPHEN 5-325 MG PO TABS: 1.0000 | ORAL_TABLET | Freq: Three times a day (TID) | ORAL | Status: DC | PRN

## 2014-08-04 ENCOUNTER — Telehealth: Payer: Self-pay

## 2014-08-04 NOTE — Telephone Encounter (Signed)
Called patient and informed Rx ready for pick up at front desk. Patient verbalized understanding.  

## 2014-08-10 ENCOUNTER — Telehealth: Payer: Self-pay | Admitting: Neurology

## 2014-08-10 NOTE — Telephone Encounter (Signed)
Rx placed up front for pick up. Patient aware.  

## 2014-08-10 NOTE — Telephone Encounter (Signed)
Patient is calling as she need a prescription for a wheel chair. Please call.

## 2014-08-10 NOTE — Telephone Encounter (Signed)
Rx for wheelchair is written,

## 2014-08-12 ENCOUNTER — Encounter (HOSPITAL_COMMUNITY): Payer: 59

## 2014-09-08 ENCOUNTER — Ambulatory Visit (INDEPENDENT_AMBULATORY_CARE_PROVIDER_SITE_OTHER): Payer: Self-pay | Admitting: Neurology

## 2014-09-08 DIAGNOSIS — G35 Multiple sclerosis: Secondary | ICD-10-CM

## 2014-09-08 DIAGNOSIS — R269 Unspecified abnormalities of gait and mobility: Secondary | ICD-10-CM

## 2014-09-10 NOTE — Progress Notes (Signed)
Alexis Cervantes (DOB: [birth year]) was seen today for the [facility] CLR_11_04 Research Trial, Visit 4.  Since the last visit, her blood pressure medication was changed, Norvasc cause increased bilateral lower extremity swelling, was stopped and started on telmisartan 80mg  since August 09 2014, tolerating it well. She has stopped trileptal, vitamin C.  She tolerates current Baclofen ER 50mg  qhs well, with mildly increased bilateral lower extremity spasticity, wants to try higher dosage  C-SSRS was performed  Patient returned medicine, pill count was performed, dispend 3 bottles of baclofen ER 60mg , documented  Patient was reminded to minimize alcohol consumption if applicable and take the study medication at the same time of day 30 minutes after the evening meal with a full glass of water.   She will return to clinic in 3 months per protocol.

## 2014-09-13 ENCOUNTER — Encounter (HOSPITAL_COMMUNITY): Payer: 59

## 2014-10-04 ENCOUNTER — Other Ambulatory Visit: Payer: Self-pay | Admitting: Neurology

## 2014-10-04 NOTE — Telephone Encounter (Signed)
Prescribed at [facility] on 02/10

## 2014-10-05 ENCOUNTER — Telehealth: Payer: Self-pay

## 2014-10-05 NOTE — Telephone Encounter (Signed)
I RETURNED CALL TO [facility] FROM [place] I LEFT A VOICEMAIL FOR HER TO CALL ME BACK PERTAINING TO PATIENTS UP COMING ECHO

## 2014-10-05 NOTE — Telephone Encounter (Signed)
CALLED RETURNED  WITH WUXL#[id number]  EXPIRES 11/19/2014 [employer]

## 2014-10-06 ENCOUNTER — Ambulatory Visit (INDEPENDENT_AMBULATORY_CARE_PROVIDER_SITE_OTHER): Payer: 59 | Admitting: Neurology

## 2014-10-06 ENCOUNTER — Encounter: Payer: Self-pay | Admitting: Neurology

## 2014-10-06 VITALS — BP 111/62 | HR 76

## 2014-10-06 DIAGNOSIS — R269 Unspecified abnormalities of gait and mobility: Secondary | ICD-10-CM | POA: Diagnosis not present

## 2014-10-06 DIAGNOSIS — G500 Trigeminal neuralgia: Secondary | ICD-10-CM | POA: Diagnosis not present

## 2014-10-06 NOTE — Progress Notes (Signed)
PATIENT: Alexis Cervantes DOB: October 29, 1960  Chief Complaint  Patient presents with  . Extremity Weakness    Reports waking this morning with weakness progressed to the point of being unable to ambulate.  Arrived to the office in a wheelchair.    HISTORICAL  Alexis Cervantes is a 54 year old right-handed black female with a history of relapsing remitting multiple sclerosis, following up of [facility] Trial.  She was diagnosed with multiple sclerosis since Feb 2006.  She presented with right jaw pain, memory loss, fatigue, blurry vision, but no gait difficulty then. She was treated with Betaseron from 2006 till 2013, then she was treated with Gilenya (from Nov 2013 till June 2015)  She began to have gradually worsening gait problems since 2012. She has numbness of the hands and feet,  have some blurred vision. She denies problems controlling the bowels or the bladder.   She was enrolled in Gilenya PREFER trial, was randomized to Gilenya since November 2013. She was very stable clinical wise, continue have baseline mild gait difficulty, ambulate with cane as needed.     MRI brain: multiple supratentorial (periventricular and subcortical) and infratentorial (cerebellar and pontine) chronic demyelinating plaques. Some of these are hypointense on T1 views. No abnormal lesions are seen on post contrast views.   She had repeat MRI of the brain in November 2013, later also in March 2014, July 2014,  while taking Gilenya, there was no significant change clinical or  Image wise.  MRI cervical most recent in 2006 showed  multiple chronic demyelinating plaques at C2, C2-3, C3, C5, C7.  No acute plaques are seen. At C4-5, C5-6, C6-7: Spondylosis and disc bulging with severe biforaminal stenosis.  At C3-4: Disc bulging there is moderate biforaminal stenosis.  She complains of left hip for a while, now no longer hurting her.  UPDATE Feb 2015,   In Jan 26th 2015, she woke up in the middle of the night  using bathroom,   noticed dense numbness at bilateral lower extremities, her legs gave out underneath her, she was not able to ambulate.  She was  admitted to the hospital,  treated with iv steroid x 3 days, followed by po tapering, now she walkswith walker,  she complains of bilateral lower extremity paresthesia, weakness, right more than left, no bowel bladder incontinence,     UPDATE June 5th 2015: She had persistent gait difficulty since Feb 2015, she has to use walker, has no significant pain, she has no bowel bladder incontinence.   We have reviewed  MRI scan of the brain in May 2015: showing multiple white matter hyperintensity in scattered throughout the brain stem, cerebellum and subcortical and periventricular white matter typical for multiple sclerosis. No enhancing lesions are noted. The presence of multiple T1 black holes and mild atrophy of corpus callosum indicates chronic disease. Overall progression of white matter changes compared with previous MRI scan dated 06/26/2004.  MRI scan of cervical spine showing mild spondylitic changes but without significant compression. Spinal cord white matter ill-defined hyperintensities throughout represent remote age demyelinating plaques. No enhancing lesions are noted. Slight progression of spinal cord white matter changes compared with MRI scan dated 07/19/2004  She is now on Tysarbri infusion since Sept 21 2015,   JC virus antibody was negative, 0.1 4 in February 2015, Jc virus antibody negative 0.08 in 02/25/2014  UPDATE Sep 8th 2015: She has been taking gabapentin as needed basis, last use was June 2015, there was no worsening of her functional status  since Gilenya was stopped since October 20, 2013, she has been taking baclofen 10 mg 2 tablets 3 times day since June fifth 2015 which did help her bilateral lower extremity spasticity.  Repeat laboratory evaluation in August showed normal CBC, WBC was 3.5.  UPDATE Oct 16th 2015: She came in  for [facility] trial, she is on stable dose of baclofen ER 30 mg every night, tolerating it well, less spasticity at evening time, but next morning, especially during the day, she noticed increased spasticity, muscle spasm achy pain  Today she was noticed to have elevated blood pressure in the range of 170/100, add on lisinopril 10mg  qday  UPDATE Oct 26th 2015: She came evening as unscheduled visit, she reported that higher dose of baclofen ER 40 mg every day works better than 30 mg, she noticed less leg stiffness, no significant side effect,  She was enrolled in [facility]-21 since Sept 23 2015, she was taking baclofen 10mg  2 tabs tid prior to enrollment (since June 2015), was given Baclofen ER 30mg  since Sept 23 2015, she tolerated the medication very well,  Baclofen ER dosage was increased from 30 to 40 mg each day in February 12 2014, she tolerating it well,  Oct 29th 2015: Keep Baclofen Er 50mg  qhs, bp is 160/80s. Doing well  Nov 26th 2015: She complains of cough with lisinopril 10 mg twice a day, no switched to norvasc 10 mg daily, doing very well  UPDATE Dec 22nd 2015: She is here according to protocol, tolerating baclofen ER 50 mg every night, without significant muscle spasm, she is now taking to Norvasc 10 mg every day, blood pressure is under better control now, no longer has cough  UPDATE May 20 2014: This is visit 7, she has been compliant with her medications, and randomization today  Update May 26 2014, She is doing very well, bilateral lower extremity stiffness, but no muscle spasm, slow worsening gait difficulty, she is considering further treatment for her lower extremities stiffness, such as EMG guided Botox injection, Also complains of intermittent right facial pain, is taking gabapentin 300 mg 3 times a day  UPDATE Feb 4th 2016: There was no significant change in her spasticity, she is taking gabapentin 300 mg 3 times a day, for her right facial pain, continue have  intermittent moderate right cheek area shooting pain, consistent with right trigeminal neuralgia,  UPDATE Feb 10th 2016: There was significant worsening of her bilateral lower extremity stiffness, spasticity, she is now taking gabapentin 300mg  tid and trileptal 150mg  for her right trigeminal pain, but continue have intermittent recurrent right facial pain, shooting down from right temporal, right cheek, no right tooth pain,  UPDATE Feb 17th 2016: She complains of worsening lefg stiffness, low back pain, worsneing gait difficulty, incontineince 2 weeek, urinary, incotnineice, getting osre,  Midline, no shooting pain, but she has more stiness pain in her antieror legs, achiness,  Gait id much worse. Not because of pain,   UPDATE June 8th 2016: She was started on baclofen ER 60 mg since Sep 08 2014, did very well for few weeks, she is on fasting over the past 2 weeks, last night she had worsening right facial trigeminal pain, took extra dose of oxycodone, woke up early morning for her Silvestre Moment infusion today, noticed sudden worsening gait difficulty, to the point of difficulty initiate her gait, needs her daughter help her dressing, transfer,  She has finished Tysarbri infusion, overall feeling better, 60% baseline, worsening right facial pain recently, is already  taking baclofen ER 60 mg daily, gabapentin 300 mg 3 tablets 3 times a day, oxycodone as needed,    REVIEW OF SYSTEMS: Full 14 system review of systems performed and notable only for as above  ALLERGIES: Allergies  Allergen Reactions  . Lisinopril     Other reaction(s): cough    HOME MEDICATIONS: Current Outpatient Prescriptions  Medication Sig Dispense Refill  . amLODipine (NORVASC) 2.5 MG tablet 1 tablet    . BACLOFEN EX ER  qday    . cholecalciferol (VITAMIN D) 1000 UNITS tablet Take 1,000 Units by mouth daily.    Marland Kitchen gabapentin (NEURONTIN) 300 MG capsule Take 900 mg by mouth 3 (three) times daily.    Marland Kitchen ibuprofen  (ADVIL,MOTRIN) 200 MG tablet Take 600 mg by mouth every 6 (six) hours as needed for headache, mild pain or moderate pain.    . Multiple Vitamin (MULTIVITAMIN WITH MINERALS) TABS tablet Take 1 tablet by mouth daily.    . natalizumab (TYSABRI) 300 MG/15ML injection 15 ml    . oxyCODONE-acetaminophen (PERCOCET) 5-325 MG per tablet Take 1 tablet by mouth every 8 (eight) hours as needed for severe pain. 90 tablet 0  . telmisartan (MICARDIS) 80 MG tablet Take 80 mg by mouth every morning.     No current facility-administered medications for this visit.    PAST MEDICAL HISTORY: Past Medical History  Diagnosis Date  . MS (multiple sclerosis) 2006  . Anemia   . Hypertension   . Thyroid disease   . Hyperthyroidism     PAST SURGICAL HISTORY: Past Surgical History  Procedure Laterality Date  . Abdominal hysterectomy    . Hernia repair    . Wisedom teeth removed      FAMILY HISTORY: Family History  Problem Relation Age of Onset  . Vision loss Mother     due to glucoma in one eye  . Diverticulitis Mother   . Glaucoma Mother   . Alcohol abuse Father   . Hypertension Sister   . Hypertension Brother   . Diverticulosis Brother   . Hypertension Brother   . Vision loss Brother     due to glucome in one eye  . Glaucoma Brother     SOCIAL HISTORY:  History   Social History  . Marital Status: Divorced    Spouse Name: N/A  . Number of Children: 3  . Years of Education: college   Occupational History  .  Other   Social History Main Topics  . Smoking status: Never Smoker   . Smokeless tobacco: Not on file  . Alcohol Use: No  . Drug Use: No  . Sexual Activity: Not on file   Other Topics Concern  . Not on file   Social History Narrative   Patient is single, has 3 children   Patient is right handed   Education level is college   caffeine consumption is 2 cups daily     PHYSICAL EXAM   Filed Vitals:   10/06/14 1132  BP: 111/62  Pulse: 76    Not recorded       There is no weight on file to calculate BMI.  PHYSICAL EXAMNIATION:  Gen: NAD, conversant, well nourised, obese, well groomed                     Cardiovascular: Regular rate rhythm, no peripheral edema, warm, nontender. Eyes: Conjunctivae clear without exudates or hemorrhage Neck: Supple, no carotid bruise. Pulmonary: Clear to auscultation bilaterally   NEUROLOGICAL  EXAM:  MENTAL STATUS: Speech:    Speech is normal; fluent and spontaneous with normal comprehension.  Cognition:    The patient is oriented to person, place, and time;     recent and remote memory intact;     language fluent;     normal attention, concentration,     fund of knowledge.  CRANIAL NERVES: CN II: Visual fields are full to confrontation. Pupils are equal round reactive to light  CN III, IV, VI: extraocular movement are normal. No ptosis. CN V: Facial sensation is intact to pinprick in all 3 divisions bilaterally. Corneal responses are intact.  CN VII: Face is symmetric with normal eye closure and smile. CN VIII: Hearing is normal to rubbing fingers CN IX, X: Palate elevates symmetrically. Phonation is normal. CN XI: Head turning and shoulder shrug are intact CN XII: Tongue is midline with normal movements and no atrophy.  MOTOR: She is in wheelchair, no significant upper extremity weakness, normal muscle tone  Moderate spasticity at bilateral lower extremity, left worse than right, hip flexion R/L 3/3 minus, knee flexion4/4, knee extension 5/4 ankle dorsiflexion 4/3.    REFLEXES: Reflexes are hyperreflexia and symmetric  SENSORY: Length dependent decreased light touch, pinprick, vibratory sensation.  COORDINATION: Mild dysmetria of right-sided finger-to-nose, lower extremity is limited because of weakness.   GAIT/STANCE: She needs 2 people assistant to get up from seated position, difficulty initiate gait, left is worse than right.   DIAGNOSTIC DATA (LABS, IMAGING, TESTING) - I reviewed  patient records, labs, notes, testing and imaging myself where available.  Lab Results  Component Value Date   WBC 6.0 02/25/2014   HGB 11.7 02/25/2014   HCT 35.7 02/25/2014   MCV 86 02/25/2014   PLT 316 02/25/2014      Component Value Date/Time   NA 142 02/25/2014 1127   NA 141 05/26/2013 0522   K 4.1 02/25/2014 1127   CL 103 02/25/2014 1127   CO2 25 02/25/2014 1127   GLUCOSE 93 02/25/2014 1127   GLUCOSE 136* 05/26/2013 0522   BUN 16 02/25/2014 1127   BUN 15 05/26/2013 0522   CREATININE 1.02* 02/25/2014 1127   CALCIUM 9.7 02/25/2014 1127   PROT 7.1 02/25/2014 1127   PROT 7.6 05/25/2013 2027   ALBUMIN 3.5 05/25/2013 2027   AST 16 02/25/2014 1127   ALT 9 02/25/2014 1127   ALKPHOS 127* 02/25/2014 1127   BILITOT 0.3 02/25/2014 1127   GFRNONAA 63 02/25/2014 1127   GFRAA 72 02/25/2014 1127   No results found for: CHOL, HDL, LDLCALC, LDLDIRECT, TRIG, CHOLHDL No results found for: VOZD6U No results found for: VITAMINB12 Lab Results  Component Value Date   TSH 0.456 06/20/2013      ASSESSMENT AND PLAN  Ladaisha Velie is a 54 y.o. female    1. relapsing remitting multiple sclerosis, was tolerating Gilenya well, but continues to have flareup while taking Gilenya, JC virus antibody was negative,Tysarbri infusion since Sept 21st 2015. she is at [facility] trial for bilateral lower extremity spasticity, continue follow-up according protocol, now on baclofen ER 60 mg since Sep 08 2014 2. Right trigeminal neuralgia: Keep gabapentin 300 mg 3 tablets 3 times a day, oxycodone as needed, 3. Worsening gait difficulty, likely due to combination of fasting, pain medications, trigeminal neuralgia, continue moderate exercise, call office for worsening gait difficulty,  4. Hypertension, her blood pressure is under good control now with amlodipine 2.5 milligrams, Micardis 80mg  daily.   Levert Feinstein, M.D. Ph.D.  [facility]  [address], [address] [place], [place]  [address] Ph: [phone] Fax: [phone]

## 2014-10-11 ENCOUNTER — Encounter (HOSPITAL_COMMUNITY): Payer: 59

## 2014-10-11 ENCOUNTER — Ambulatory Visit: Payer: Self-pay | Admitting: Neurology

## 2014-10-13 ENCOUNTER — Encounter (HOSPITAL_COMMUNITY): Payer: 59

## 2014-10-13 ENCOUNTER — Other Ambulatory Visit: Payer: Self-pay | Admitting: Neurology

## 2014-10-13 MED ORDER — OXYCODONE-ACETAMINOPHEN 5-325 MG PO TABS: 1.0000 | ORAL_TABLET | Freq: Three times a day (TID) | ORAL | Status: DC | PRN

## 2014-10-13 NOTE — Telephone Encounter (Signed)
Patient called requesting a refill for oxyCODONE-acetaminophen (PERCOCET) 5-325 MG per tablet. Patient was advised that the script will be ready for pick up in 24 hrs unless she hears otherwise by the nurse. Patient can be reached @ [phone]

## 2014-10-13 NOTE — Telephone Encounter (Signed)
Request entered, forwarded to provider for approval.  

## 2014-10-14 ENCOUNTER — Other Ambulatory Visit: Payer: Self-pay | Admitting: Family Medicine

## 2014-10-14 ENCOUNTER — Ambulatory Visit (HOSPITAL_COMMUNITY): Payer: 59 | Attending: Internal Medicine

## 2014-10-14 ENCOUNTER — Telehealth: Payer: Self-pay | Admitting: *Deleted

## 2014-10-14 ENCOUNTER — Other Ambulatory Visit: Payer: Self-pay

## 2014-10-14 DIAGNOSIS — R011 Cardiac murmur, unspecified: Secondary | ICD-10-CM

## 2014-10-14 DIAGNOSIS — I071 Rheumatic tricuspid insufficiency: Secondary | ICD-10-CM | POA: Insufficient documentation

## 2014-10-14 NOTE — Telephone Encounter (Signed)
Oxycodone rx printed, signed and placed up front for pick up.

## 2014-11-08 ENCOUNTER — Encounter (HOSPITAL_COMMUNITY): Payer: 59

## 2014-11-10 ENCOUNTER — Encounter (HOSPITAL_COMMUNITY): Payer: 59

## 2014-12-08 ENCOUNTER — Encounter (HOSPITAL_COMMUNITY): Payer: 59

## 2014-12-09 ENCOUNTER — Ambulatory Visit (INDEPENDENT_AMBULATORY_CARE_PROVIDER_SITE_OTHER): Payer: Self-pay | Admitting: Neurology

## 2014-12-09 DIAGNOSIS — G35 Multiple sclerosis: Secondary | ICD-10-CM

## 2014-12-09 DIAGNOSIS — G500 Trigeminal neuralgia: Secondary | ICD-10-CM

## 2014-12-09 DIAGNOSIS — R269 Unspecified abnormalities of gait and mobility: Secondary | ICD-10-CM

## 2014-12-09 MED ORDER — OXYCODONE-ACETAMINOPHEN 5-325 MG PO TABS: 1.0000 | ORAL_TABLET | Freq: Three times a day (TID) | ORAL | Status: DC | PRN

## 2014-12-09 NOTE — Progress Notes (Signed)
PATIENT: Alexis Cervantes DOB: [birth year]  No chief complaint on file.   HISTORICAL  Alexis Cervantes is a 54 year old right-handed black female follow up for relapsing remitting multiple sclerosis  She was diagnosed with multiple sclerosis since Feb 2006.  She presented with right jaw pain, memory loss, fatigue, blurry vision, but no gait difficulty then. She was treated with Betaseron from 2006 till 2013, then she was treated with Gilenya (from Nov 2013 till June 2015), started Silvestre Moment since September 2015, JC virus antibody was negative,  She began to have gradually worsening gait problems since 2012. She has numbness of the hands and feet,  have some blurred vision. She denies problems controlling the bowels or the bladder.   She was enrolled in Gilenya PREFER trial, was randomized to Gilenya since November 2013. She was very stable clinical wise, continue have baseline mild gait difficulty, ambulate with cane as needed.     MRI brain: multiple supratentorial (periventricular and subcortical) and infratentorial (cerebellar and pontine) chronic demyelinating plaques. Some of these are hypointense on T1 views. No abnormal lesions are seen on post contrast views.  She also had significant cervical cord involvement since beginning. MRI cervical most recent in 2006 showed  multiple chronic demyelinating plaques at C2, C2-3, C3, C5, C7.  No acute plaques are seen. At C4-5, C5-6, C6-7: Spondylosis and disc bulging with severe biforaminal stenosis.  At C3-4: Disc bulging there is moderate biforaminal stenosis.  She continued to have flareup while taking Gilenya, progressive gait difficulty In Jan 26th 2015, dense numbness involving bilateral lower extremity, worsening gait difficulty,  She was  admitted to the hospital,  treated with iv steroid x 3 days, followed by po tapering,   She is also on baclofen bilateral lower extremity spasticity, was enrolled in baclofen extended release trial since  January 20 2014, is now taking baclofen ER 60 mg every night  She continued to have right trigeminal pain, take gabapentin 300 mg 3 times a day, occasionally oxycodone  She has slow progressive gait difficulty, urinary urgency, need assistant to get up from seated position, rely on her walker, planning on moving to [place] soon.   REVIEW OF SYSTEMS: Full 14 system review of systems performed and notable only for as above  ALLERGIES: Allergies  Allergen Reactions  . Lisinopril     Other reaction(s): cough    HOME MEDICATIONS: Current Outpatient Prescriptions  Medication Sig Dispense Refill  . amLODipine (NORVASC) 2.5 MG tablet 1 tablet    . BACLOFEN EX ER 50mg  qday    . cholecalciferol (VITAMIN D) 1000 UNITS tablet Take 1,000 Units by mouth daily.    Marland Kitchen gabapentin (NEURONTIN) 300 MG capsule Take 900 mg by mouth 3 (three) times daily.    Marland Kitchen ibuprofen (ADVIL,MOTRIN) 200 MG tablet Take 600 mg by mouth every 6 (six) hours as needed for headache, mild pain or moderate pain.    . Multiple Vitamin (MULTIVITAMIN WITH MINERALS) TABS tablet Take 1 tablet by mouth daily.    . natalizumab (TYSABRI) 300 MG/15ML injection 15 ml    . oxyCODONE-acetaminophen (PERCOCET) 5-325 MG per tablet Take 1 tablet by mouth every 8 (eight) hours as needed for severe pain. 90 tablet 0  . telmisartan (MICARDIS) 80 MG tablet Take 80 mg by mouth every morning.     No current facility-administered medications for this visit.    PAST MEDICAL HISTORY: Past Medical History  Diagnosis Date  . MS (multiple sclerosis) 2006  . Anemia   .  Hypertension   . Thyroid disease   . Hyperthyroidism     PAST SURGICAL HISTORY: Past Surgical History  Procedure Laterality Date  . Abdominal hysterectomy    . Hernia repair    . Wisedom teeth removed      FAMILY HISTORY: Family History  Problem Relation Age of Onset  . Vision loss Mother     due to glucoma in one eye  . Diverticulitis Mother   . Glaucoma Mother   .  Alcohol abuse Father   . Hypertension Sister   . Hypertension Brother   . Diverticulosis Brother   . Hypertension Brother   . Vision loss Brother     due to glucome in one eye  . Glaucoma Brother     SOCIAL HISTORY:  Social History   Social History  . Marital Status: Divorced    Spouse Name: N/A  . Number of Children: 3  . Years of Education: college   Occupational History  .  Other   Social History Main Topics  . Smoking status: Never Smoker   . Smokeless tobacco: Not on file  . Alcohol Use: No  . Drug Use: No  . Sexual Activity: Not on file   Other Topics Concern  . Not on file   Social History Narrative   Patient is single, has 3 children   Patient is right handed   Education level is college   caffeine consumption is 2 cups daily     PHYSICAL EXAM   There were no vitals filed for this visit.  Not recorded      There is no weight on file to calculate BMI.  PHYSICAL EXAMNIATION:  Gen: NAD, conversant, well nourised, obese, well groomed                     Cardiovascular: Regular rate rhythm, no peripheral edema, warm, nontender. Eyes: Conjunctivae clear without exudates or hemorrhage Neck: Supple, no carotid bruise. Pulmonary: Clear to auscultation bilaterally   NEUROLOGICAL EXAM:  MENTAL STATUS: Speech:    Speech is normal; fluent and spontaneous with normal comprehension.  Cognition:    The patient is oriented to person, place, and time;     recent and remote memory intact;     language fluent;     normal attention, concentration,     fund of knowledge.  CRANIAL NERVES: CN II: Visual fields are full to confrontation. Pupils are equal round reactive to light  CN III, IV, VI: extraocular movement are normal. No ptosis. CN V: Facial sensation is intact to pinprick in all 3 divisions bilaterally. Corneal responses are intact.  CN VII: Face is symmetric with normal eye closure and smile. CN VIII: Hearing is normal to rubbing fingers CN IX, X:  Palate elevates symmetrically. Phonation is normal. CN XI: Head turning and shoulder shrug are intact CN XII: Tongue is midline with normal movements and no atrophy.  MOTOR: Mild left hand grip weakness, 5 minus. Moderate spasticity at bilateral lower extremity left worse than right, hip flexion R/L 3/3 minus, knee flexion4/4-, knee extension 5/4 ankle dorsiflexion 4/3.    REFLEXES: Reflexes are hyperreflexia and symmetric, bilateral Babinski signs  SENSORY: Length dependent decreased light touch, pinprick, vibratory sensation to knee level.  COORDINATION: Mild dysmetria of right-sided finger-to-nose, lower extremity is limited because of weakness.   GAIT/STANCE: She needs 2 people assistant to get up from seated position, difficulty initiate gait, left is worse than right.   DIAGNOSTIC DATA (LABS, IMAGING,  TESTING) - I reviewed patient records, labs, notes, testing and imaging myself where available.  Relapsing remediating multiple sclerosis: Diagnosed since February 2006 Treatment:  Betaseron from 2006 till 2013,   Velora Heckler  Nov 2013-June 2015  Silvestre Moment January 18 2014 JC virus: Negative, 0.1 4 in February 2015, 0.08 in 02/25/2014 MRIs:  MRI scan of the brain in May 2015: showing multiple white matter hyperintensity in scattered throughout the brain stem, cerebellum and subcortical and periventricular white matter typical for multiple sclerosis. No enhancing lesions are noted. The presence of multiple T1 black holes and mild atrophy of corpus callosum indicates chronic disease. Overall progression of white matter changes compared with previous MRI scan dated 06/26/2004.   MRI scan of cervical spine showing mild spondylitic changes but without significant compression. Spinal cord white matter ill-defined hyperintensities throughout represent remote age demyelinating plaques. No enhancing lesions are noted. Slight progression of spinal cord white matter changes compared with MRI scan  dated 07/19/2004   ASSESSMENT AND PLAN  Ceasia Defrain is a 54 y.o. female    Relapsing remitting multiple sclerosis  She continues to have flareup while taking Gilenya, JC virus antibody was negative,Tysarbri infusion since Sept 21st 2015. she is at [facility] trial for bilateral  lower extremity spasticity, continue follow-up according protocol, now on baclofen ER 60 mg since Sep 08 2014  Repeat MRI of the brain with and without contrast, JC virus antibody at next visit in 3 months Right trigeminal neuralgia:   Keep gabapentin 300 mg 3 tablets 3 times a day, oxycodone as needed, Worsening gait difficulty Hypertension,   her blood pressure is under good control now with amlodipine 2.5 milligrams, Micardis 80mg  daily.   Levert Feinstein, M.D. Ph.D.  [facility] [address], [address] [place], [place] [address] Ph: [phone] Fax: [phone]

## 2014-12-16 ENCOUNTER — Encounter: Payer: 59 | Admitting: Neurology

## 2015-01-05 ENCOUNTER — Encounter (HOSPITAL_COMMUNITY): Payer: 59

## 2015-01-24 ENCOUNTER — Telehealth: Payer: Self-pay

## 2015-01-24 NOTE — Telephone Encounter (Signed)
I spoke to the patient in regards to the [facility] study. I advised the patient to bring back the bottles of Baclofen IR that were dispensed on 11AUG2016, and that I will provide her with new medication. The patient stated that her next infusion at the office will be on [id number], and she will bring the bottles then to exchange them for new medication/

## 2015-03-09 ENCOUNTER — Ambulatory Visit (INDEPENDENT_AMBULATORY_CARE_PROVIDER_SITE_OTHER): Payer: Self-pay | Admitting: Neurology

## 2015-03-09 DIAGNOSIS — R269 Unspecified abnormalities of gait and mobility: Secondary | ICD-10-CM

## 2015-03-09 DIAGNOSIS — G35 Multiple sclerosis: Secondary | ICD-10-CM

## 2015-03-09 DIAGNOSIS — Z0289 Encounter for other administrative examinations: Secondary | ICD-10-CM

## 2015-03-09 MED ORDER — BACLOFEN 20 MG PO TABS
20.0000 mg | ORAL_TABLET | Freq: Three times a day (TID) | ORAL | Status: AC
Start: 2015-03-09 — End: ?

## 2015-03-09 MED ORDER — OXYCODONE-ACETAMINOPHEN 5-325 MG PO TABS: 1.0000 | ORAL_TABLET | Freq: Three times a day (TID) | ORAL | Status: AC | PRN

## 2015-03-25 ENCOUNTER — Other Ambulatory Visit: Payer: Self-pay | Admitting: Neurology

## 2015-04-04 NOTE — Progress Notes (Signed)
PATIENT: Alexis Cervantes DOB: 1960/06/20  No chief complaint on file.   HISTORICAL  Alexis Cervantes is a 54 year old right-handed black female follow up for relapsing remitting multiple sclerosis  She was diagnosed with multiple sclerosis since Feb 2006.  She presented with right jaw pain, memory loss, fatigue, blurry vision, but no gait difficulty then. She was treated with Betaseron from 2006 till 2013, then she was treated with Gilenya (from Nov 2013 till June 2015), started Silvestre Moment since September 2015, JC virus antibody was negative,  She began to have gradually worsening gait problems since 2012. She has numbness of the hands and feet,  have some blurred vision. She denies problems controlling the bowels or the bladder.   She was enrolled in Gilenya PREFER trial, was randomized to Gilenya since November 2013. She was very stable clinical wise, continue have baseline mild gait difficulty, ambulate with cane as needed.     MRI brain: multiple supratentorial (periventricular and subcortical) and infratentorial (cerebellar and pontine) chronic demyelinating plaques. Some of these are hypointense on T1 views. No abnormal lesions are seen on post contrast views.  She also had significant cervical cord involvement since beginning. MRI cervical most recent in 2006 showed  multiple chronic demyelinating plaques at C2, C2-3, C3, C5, C7.  No acute plaques are seen. At C4-5, C5-6, C6-7: Spondylosis and disc bulging with severe biforaminal stenosis.  At C3-4: Disc bulging there is moderate biforaminal stenosis.  She continued to have flareup while taking Gilenya, progressive gait difficulty In Jan 26th 2015, dense numbness involving bilateral lower extremity, worsening gait difficulty,  She was  admitted to the hospital,  treated with iv steroid x 3 days, followed by po tapering,   She is also on baclofen bilateral lower extremity spasticity, was enrolled in baclofen extended release trial since  January 20 2014, is now taking baclofen ER 60 mg every night  She continued to have right trigeminal pain, take gabapentin 300 mg 3 times a day, occasionally oxycodone  She has slow progressive gait difficulty, urinary urgency, need assistant to get up from seated position, rely on her walker, planning on moving to [place] soon. This is her exit visit, research information was documented separately  REVIEW OF SYSTEMS: Full 14 system review of systems performed and notable only for as above  ALLERGIES: Allergies  Allergen Reactions  . Lisinopril     Other reaction(s): cough    HOME MEDICATIONS: Current Outpatient Prescriptions  Medication Sig Dispense Refill  . amLODipine (NORVASC) 10 MG tablet TAKE ONE TABLET BY MOUTH ONCE DAILY 90 tablet 4  . baclofen (LIORESAL) 20 MG tablet Take 1 tablet (20 mg total) by mouth 3 (three) times daily. 270 each 3  . cholecalciferol (VITAMIN D) 1000 UNITS tablet Take 1,000 Units by mouth daily.    Marland Kitchen gabapentin (NEURONTIN) 300 MG capsule Take 900 mg by mouth 3 (three) times daily.    Marland Kitchen ibuprofen (ADVIL,MOTRIN) 200 MG tablet Take 600 mg by mouth every 6 (six) hours as needed for headache, mild pain or moderate pain.    . Multiple Vitamin (MULTIVITAMIN WITH MINERALS) TABS tablet Take 1 tablet by mouth daily.    . natalizumab (TYSABRI) 300 MG/15ML injection 15 ml    . oxyCODONE-acetaminophen (PERCOCET) 5-325 MG tablet Take 1 tablet by mouth every 8 (eight) hours as needed for severe pain. 90 tablet 0  . telmisartan (MICARDIS) 80 MG tablet Take 80 mg by mouth every morning.     No current facility-administered medications  for this visit.    PAST MEDICAL HISTORY: Past Medical History  Diagnosis Date  . MS (multiple sclerosis) 2006  . Anemia   . Hypertension   . Thyroid disease   . Hyperthyroidism     PAST SURGICAL HISTORY: Past Surgical History  Procedure Laterality Date  . Abdominal hysterectomy    . Hernia repair    . Wisedom teeth removed       FAMILY HISTORY: Family History  Problem Relation Age of Onset  . Vision loss Mother     due to glucoma in one eye  . Diverticulitis Mother   . Glaucoma Mother   . Alcohol abuse Father   . Hypertension Sister   . Hypertension Brother   . Diverticulosis Brother   . Hypertension Brother   . Vision loss Brother     due to glucome in one eye  . Glaucoma Brother     SOCIAL HISTORY:  Social History   Social History  . Marital Status: Divorced    Spouse Name: N/A  . Number of Children: 3  . Years of Education: college   Occupational History  .  Other   Social History Main Topics  . Smoking status: Never Smoker   . Smokeless tobacco: Not on file  . Alcohol Use: No  . Drug Use: No  . Sexual Activity: Not on file   Other Topics Concern  . Not on file   Social History Narrative   Patient is single, has 3 children   Patient is right handed   Education level is college   caffeine consumption is 2 cups daily     PHYSICAL EXAM   There were no vitals filed for this visit.  Not recorded      There is no weight on file to calculate BMI.  PHYSICAL EXAMNIATION:  Gen: NAD, conversant, well nourised, obese, well groomed                     Cardiovascular: Regular rate rhythm, no peripheral edema, warm, nontender. Eyes: Conjunctivae clear without exudates or hemorrhage Neck: Supple, no carotid bruise. Pulmonary: Clear to auscultation bilaterally   NEUROLOGICAL EXAM:  MENTAL STATUS: Speech:    Speech is normal; fluent and spontaneous with normal comprehension.  Cognition:    The patient is oriented to person, place, and time;     recent and remote memory intact;     language fluent;     normal attention, concentration,     fund of knowledge.  CRANIAL NERVES: CN II: Visual fields are full to confrontation. Pupils are equal round reactive to light  CN III, IV, VI: extraocular movement are normal. No ptosis. CN V: Facial sensation is intact to pinprick in all  3 divisions bilaterally. Corneal responses are intact.  CN VII: Face is symmetric with normal eye closure and smile. CN VIII: Hearing is normal to rubbing fingers CN IX, X: Palate elevates symmetrically. Phonation is normal. CN XI: Head turning and shoulder shrug are intact CN XII: Tongue is midline with normal movements and no atrophy.  MOTOR: Mild left hand grip weakness, 5 minus. Moderate spasticity at bilateral lower extremity left worse than right, hip flexion R/L 3/3 minus, knee flexion4/4-, knee extension 5/4 ankle dorsiflexion 4/3.    REFLEXES: Reflexes are hyperreflexia and symmetric, bilateral Babinski signs  SENSORY: Length dependent decreased light touch, pinprick, vibratory sensation to knee level.  COORDINATION: Mild dysmetria of right-sided finger-to-nose, lower extremity is limited because of weakness.  GAIT/STANCE: She needs 2 people assistant to get up from seated position, difficulty initiate gait, left is worse than right.   DIAGNOSTIC DATA (LABS, IMAGING, TESTING) - I reviewed patient records, labs, notes, testing and imaging myself where available.  Relapsing remediating multiple sclerosis: Diagnosed since February 2006 Treatment:  Betaseron from 2006 till 2013,   Velora Heckler  Nov 2013-June 2015  Silvestre Moment January 18 2014 JC virus: Negative, 0.1 4 in February 2015, 0.08 in 02/25/2014 MRIs:  MRI scan of the brain in May 2015: showing multiple white matter hyperintensity in scattered throughout the brain stem, cerebellum and subcortical and periventricular white matter typical for multiple sclerosis. No enhancing lesions are noted. The presence of multiple T1 black holes and mild atrophy of corpus callosum indicates chronic disease. Overall progression of white matter changes compared with previous MRI scan dated 06/26/2004.   MRI scan of cervical spine showing mild spondylitic changes but without significant compression. Spinal cord white matter ill-defined  hyperintensities throughout represent remote age demyelinating plaques. No enhancing lesions are noted. Slight progression of spinal cord white matter changes compared with MRI scan dated 07/19/2004   ASSESSMENT AND PLAN  Trinaty Bundrick is a 54 y.o. female    Relapsing remitting multiple sclerosis  She continues to have flareup while taking Gilenya, JC virus antibody was negative,Tysarbri infusion since Sept 21st 2015. she is at [facility] trial for bilateral  lower extremity spasticity, switch her to equivalent dose of baclofen 60 mg daily  Repeat MRI of the brain with and without contrast, JC virus antibody at next visit in 3 months Right trigeminal neuralgia:   Keep gabapentin 300 mg 3 tablets 3 times a day, oxycodone as needed, Worsening gait difficulty Hypertension,   her blood pressure is under good control now with amlodipine 2.5 milligrams, Micardis  daily.   Levert Feinstein, M.D. Ph.D.  [facility] [address], [address] [place], [place] [address] Ph: [phone] Fax: [phone]

## 2015-04-20 ENCOUNTER — Telehealth: Payer: Self-pay | Admitting: Neurology

## 2015-04-20 NOTE — Telephone Encounter (Signed)
Swelling like this needs to be seen urgently. Best to see primary care physician for this. If not possible to see PCP, she is advised to go to urgent care or ER. Please call patient back and advise her.

## 2015-04-20 NOTE — Telephone Encounter (Signed)
Pt called and states that her legs are both swollen and are weeping. Started about 2 weeks ago. They started to weep today.States that she has not had this happen before. They do not hurt, are not warm to touch. Pt takes b/p medication for high b/p. Pt not diabetic. Pt has MS. Please call and advise [phone]

## 2015-04-20 NOTE — Telephone Encounter (Signed)
I called the patient and advised that, per Dr. Frances Furbish, she should be seen urgently. I advised that if she could not get a ride, she should call an ambulance, rather than wait until tomorrow. She verbalized understanding.

## 2015-04-20 NOTE — Telephone Encounter (Signed)
Spoke to patient. Gave instructions per Dr. Teofilo Pod previous note. Patient stated she does not have the transportation to go anywhere until tomorrow when her daughters come in town. Encouraged patient to contact emergency services (ambulance). Patient said she would see if she could find a ride, if not will wait until tomorrow when her daughter comes to take her to her infusion.

## 2015-04-21 NOTE — Telephone Encounter (Signed)
I called the patient back. She states she is doing okay. She plans on going to the doctor tomorrow. I advised that we would be closed tomorrow and Monday and I just wanted to make sure she got someone to look at her legs. She promised she is gong to see a doctor tomorrow.

## 2015-04-21 NOTE — Telephone Encounter (Signed)
Patient is returning a call. °

## 2015-04-21 NOTE — Telephone Encounter (Signed)
I called to check on patient. I do not see any ED notes in the system. Left voicemail asking her to call me back.

## 2015-05-16 ENCOUNTER — Telehealth: Payer: Self-pay

## 2015-05-16 NOTE — Telephone Encounter (Signed)
Received a message from Fabian Sharp, RN in our infusion suite that pt called requesting a referral to new infusion suite.

## 2015-05-16 NOTE — Telephone Encounter (Signed)
Spoke to Dr. Terrace Arabia. Dr. Terrace Arabia will provide Tysabri infusion orders until March 2017 but pt will need to find her a new neurologist near [place]. Spoke to pt and advised her of this. She says she has set up an appt with a PCP and has an appt with them on Friday and she will get a referral to a local neurologist then. She is asking that I fax the needed information to [facility] in  [place] so she may get her tysabri infusions.  Spoke to Lavelle at [employer] [facility] in [place]. She is requesting demographics, labs, the tysabri RX, and OV notes to fax: [phone]

## 2015-05-16 NOTE — Telephone Encounter (Signed)
Called to ask pt where she would like her infusions completed. No answer, left a message asking her to call back.

## 2015-05-17 NOTE — Telephone Encounter (Signed)
Paperwork faxed and confirmed to the site below.

## 2015-06-22 DIAGNOSIS — Z0279 Encounter for issue of other medical certificate: Secondary | ICD-10-CM

## 2015-06-28 ENCOUNTER — Telehealth: Payer: Self-pay | Admitting: *Deleted

## 2015-06-28 NOTE — Telephone Encounter (Signed)
Patient Disability [employer] form on [employer].

## 2015-07-05 ENCOUNTER — Telehealth: Payer: Self-pay | Admitting: *Deleted

## 2015-07-05 NOTE — Telephone Encounter (Signed)
Patient form refax on 07/05/15 to Disability [employer].

## 2015-07-05 NOTE — Telephone Encounter (Signed)
Pt called to check on her disability form. Please call and advise [phone]

## 2015-07-12 ENCOUNTER — Telehealth: Payer: Self-pay | Admitting: Neurology

## 2015-07-12 NOTE — Telephone Encounter (Signed)
Alexis Cervantes is needing to cancel the current natalizumab (TYSABRI) 300 MG/15ML injection rx . Please call [phone].

## 2015-07-12 NOTE — Telephone Encounter (Signed)
Called [employer] back and spoke to a pharmacist.  She feels this call was made in error.  Alexis Cervantes is receving her Tysabri infusions through [facility] in [place].

## 2015-10-12 ENCOUNTER — Telehealth: Payer: Self-pay | Admitting: *Deleted

## 2015-10-12 NOTE — Telephone Encounter (Signed)
Pt report faxed to [facility] on 09/1415.
